# Patient Record
Sex: Female | Born: 1960 | Race: White | Hispanic: No | Marital: Married | Smoking: Never smoker
Health system: Southern US, Community
[De-identification: ages and names within clinical notes are randomized; demographics above are authoritative.]

## PROBLEM LIST (undated history)

## (undated) DIAGNOSIS — Q513 Bicornate uterus: Secondary | ICD-10-CM

## (undated) DIAGNOSIS — E041 Nontoxic single thyroid nodule: Secondary | ICD-10-CM

## (undated) DIAGNOSIS — D126 Benign neoplasm of colon, unspecified: Secondary | ICD-10-CM

## (undated) DIAGNOSIS — E039 Hypothyroidism, unspecified: Secondary | ICD-10-CM

## (undated) DIAGNOSIS — K645 Perianal venous thrombosis: Secondary | ICD-10-CM

## (undated) DIAGNOSIS — E559 Vitamin D deficiency, unspecified: Secondary | ICD-10-CM

## (undated) DIAGNOSIS — M1990 Unspecified osteoarthritis, unspecified site: Secondary | ICD-10-CM

## (undated) DIAGNOSIS — K219 Gastro-esophageal reflux disease without esophagitis: Secondary | ICD-10-CM

## (undated) DIAGNOSIS — F4320 Adjustment disorder, unspecified: Secondary | ICD-10-CM

## (undated) DIAGNOSIS — J029 Acute pharyngitis, unspecified: Secondary | ICD-10-CM

## (undated) DIAGNOSIS — R319 Hematuria, unspecified: Secondary | ICD-10-CM

## (undated) DIAGNOSIS — IMO0002 Reserved for concepts with insufficient information to code with codable children: Secondary | ICD-10-CM

## (undated) HISTORY — DX: Unspecified osteoarthritis, unspecified site: M19.90

## (undated) HISTORY — DX: Reserved for concepts with insufficient information to code with codable children: IMO0002

## (undated) HISTORY — DX: Acute pharyngitis, unspecified: J02.9

## (undated) HISTORY — DX: Perianal venous thrombosis: K64.5

## (undated) HISTORY — DX: Gastro-esophageal reflux disease without esophagitis: K21.9

## (undated) HISTORY — DX: Bicornate uterus: Q51.3

## (undated) HISTORY — DX: Vitamin D deficiency, unspecified: E55.9

## (undated) HISTORY — DX: Benign neoplasm of colon, unspecified: D12.6

## (undated) HISTORY — DX: Hematuria, unspecified: R31.9

## (undated) HISTORY — DX: Hypothyroidism, unspecified: E03.9

## (undated) HISTORY — DX: Nontoxic single thyroid nodule: E04.1

## (undated) HISTORY — DX: Adjustment disorder, unspecified: F43.20

---

## 1983-09-17 HISTORY — PX: TONSILLECTOMY: SHX5217

## 1989-09-16 HISTORY — PX: THYROIDECTOMY, PARTIAL: SHX18

## 2000-12-30 ENCOUNTER — Encounter: Payer: Self-pay | Admitting: Family Medicine

## 2000-12-30 ENCOUNTER — Ambulatory Visit (HOSPITAL_COMMUNITY): Admission: RE | Admit: 2000-12-30 | Discharge: 2000-12-30 | Payer: Self-pay | Admitting: Family Medicine

## 2006-06-18 DIAGNOSIS — K645 Perianal venous thrombosis: Secondary | ICD-10-CM | POA: Insufficient documentation

## 2006-06-23 ENCOUNTER — Encounter: Payer: Self-pay | Admitting: Family Medicine

## 2006-07-17 ENCOUNTER — Encounter: Payer: Self-pay | Admitting: Family Medicine

## 2008-03-01 ENCOUNTER — Ambulatory Visit: Payer: Self-pay | Admitting: Unknown Physician Specialty

## 2012-01-23 ENCOUNTER — Encounter: Payer: Self-pay | Admitting: Cardiovascular Disease

## 2012-01-23 ENCOUNTER — Ambulatory Visit (INDEPENDENT_AMBULATORY_CARE_PROVIDER_SITE_OTHER): Payer: BC Managed Care – PPO | Admitting: Cardiovascular Disease

## 2012-01-23 VITALS — BP 124/83 | HR 64 | Ht 66.0 in | Wt 172.8 lb

## 2012-01-23 DIAGNOSIS — I498 Other specified cardiac arrhythmias: Secondary | ICD-10-CM

## 2012-01-23 DIAGNOSIS — R079 Chest pain, unspecified: Secondary | ICD-10-CM | POA: Insufficient documentation

## 2012-01-23 DIAGNOSIS — R0989 Other specified symptoms and signs involving the circulatory and respiratory systems: Secondary | ICD-10-CM

## 2012-01-23 DIAGNOSIS — R0609 Other forms of dyspnea: Secondary | ICD-10-CM

## 2012-01-23 DIAGNOSIS — R0600 Dyspnea, unspecified: Secondary | ICD-10-CM | POA: Insufficient documentation

## 2012-01-23 DIAGNOSIS — R001 Bradycardia, unspecified: Secondary | ICD-10-CM | POA: Insufficient documentation

## 2012-01-23 NOTE — Progress Notes (Signed)
HPI  This is a 51 year old female who is referred by Liane Comber for evaluation of bradycardia, chest pain and dyspnea. The patient had no previous cardiac history. She has no history of hypertension, diabetes or hyperlipidemia. She has history of hypothyroidism on replacement therapy and her thyroid function has within normal limits. She also has history of GERD. She was noted recently to be slightly bradycardic with heart rate in the 50s. She was asymptomatic and complained of no dizziness, syncope or presyncope. She also reports chest pain described as burning sensation which lasts usually on 5 minutes and happens at rest. It is substernal and does not radiate. It does not worsen with physical activities. She does report increased exertional dyspnea a few months duration. 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 She had routine labs done recently which were unremarkable. She also had a chest x-ray which was normal. She has no history of lung disease. Recent PFTs were unremarkable. She does feel that some of her symptoms might be related to menopause  Allergies  Allergen Reactions  . Codeine   . Penicillins      Current Outpatient Prescriptions on File Prior to Visit  Medication Sig Dispense Refill  . levothyroxine (SYNTHROID, LEVOTHROID) 100 MCG tablet Take 100 mcg by mouth daily.      Marland Kitchen omeprazole (PRILOSEC) 40 MG capsule Take 40 mg by mouth daily.      Marland Kitchen PROGESTERONE MICRONIZED PO Take 2 % by mouth as needed.         Past Medical History  Diagnosis Date  . Unspecified adjustment reaction   . Bicornuate uterus   . Benign neoplasm of colon   . Degeneration of intervertebral disc, site unspecified   . Esophageal reflux   . Hematuria, unspecified   . External thrombosed hemorrhoids   . Unspecified hypothyroidism   . Osteoarthrosis, unspecified whether generalized or localized, unspecified site   . Acute pharyngitis     . Nontoxic uninodular goiter   . Unspecified vitamin D deficiency      Past Surgical History  Procedure Date  . Thyroidectomy, partial 1991  . Tonsillectomy 1985     Family History  Problem Relation Age of Onset  . Heart disease Father      History   Social History  . Marital Status: Married    Spouse Name: N/A    Number of Children: N/A  . Years of Education: N/A   Occupational History  . Not on file.   Social History Main Topics  . Smoking status: Never Smoker   . Smokeless tobacco: Not on file  . Alcohol Use: Yes  . Drug Use:   . Sexually Active:    Other Topics Concern  . Not on file   Social History Narrative  . No narrative on file     ROS Constitutional: Negative for fever, chills, diaphoresis, activity change, appetite change.  HENT: Negative for hearing loss, nosebleeds, congestion, sore throat, facial swelling, drooling, trouble swallowing, neck pain, voice change, sinus pressure and tinnitus.  Eyes: Negative for photophobia, pain, discharge and visual disturbance.  Respiratory: Negative for apnea, cough and wheezing.  Cardiovascular: Negative for palpitations and leg swelling.  Gastrointestinal: Negative for nausea, vomiting, abdominal pain, diarrhea, constipation, blood in stool and abdominal distention.  Genitourinary: Negative for dysuria, urgency, frequency, hematuria and decreased urine volume.  Musculoskeletal: Negative for myalgias, back pain, joint swelling, arthralgias and gait problem.  Skin: Negative  for color change, pallor, rash and wound.  Neurological: Negative for dizziness, tremors, seizures, syncope, speech difficulty, weakness, light-headedness, numbness and headaches.  Psychiatric/Behavioral: Negative for suicidal ideas, hallucinations, behavioral problems and agitation. The patient is not nervous/anxious.     PHYSICAL EXAM   BP 124/83  Pulse 64  Ht 5\' 6"  (1.676 m)  Wt 172 lb 12.8 oz (78.382 kg)  BMI 27.89  kg/m2 Constitutional: She is oriented to person, place, and time. She appears well-developed and well-nourished. No distress.  HENT: No nasal discharge.  Head: Normocephalic and atraumatic.  Eyes: Pupils are equal and round. Right eye exhibits no discharge. Left eye exhibits no discharge.  Neck: Normal range of motion. Neck supple. No JVD present. No thyromegaly present.  Cardiovascular: Normal rate, regular rhythm, normal heart sounds. Exam reveals no gallop and no friction rub. No murmur heard.  Pulmonary/Chest: Effort normal and breath sounds normal. No stridor. No respiratory distress. She has no wheezes. She has no rales. She exhibits no tenderness.  Abdominal: Soft. Bowel sounds are normal. She exhibits no distension. There is no tenderness. There is no rebound and no guarding.  Musculoskeletal: Normal range of motion. She exhibits no edema and no tenderness.  Neurological: She is alert and oriented to person, place, and time. Coordination normal.  Skin: Skin is warm and dry. No rash noted. She is not diaphoretic. No erythema. No pallor.  Psychiatric: She has a normal mood and affect. Her behavior is normal. Judgment and thought content normal.     EKG: Normal sinus rhythm with no significant ST or T wave changes. Septal Q waves are likely a normal variant.   ASSESSMENT AND PLAN

## 2012-01-23 NOTE — Patient Instructions (Signed)
Your physician has requested that you have an echocardiogram. Echocardiography is a painless test that uses sound waves to create images of your heart. It provides your doctor with information about the size and shape of your heart and how well your heart's chambers and valves are working. This procedure takes approximately one hour. There are no restrictions for this procedure.  Your physician has requested that you have an exercise tolerance test. For further information please visit [email or web address]. Please also follow instruction sheet, as given.  Follow up after tests.  

## 2012-01-23 NOTE — Assessment & Plan Note (Signed)
This is overall asymptomatic. Her ECG does not show significant conduction abnormalities. I don't think that this requires further attention at this time given that she reports no symptoms related to this.

## 2012-01-23 NOTE — Assessment & Plan Note (Signed)
She reports significant increase in exertional dyspnea lately. This is unusual for her given that she has been active all her lives. I will obtain an echocardiogram for further evaluation.

## 2012-01-23 NOTE — Assessment & Plan Note (Signed)
Her chest pain is overall atypical and likely due to GERD. However, due to her risk factors and associated exertional dyspnea, I recommend a treadmill stress test for further evaluation.

## 2012-01-28 ENCOUNTER — Other Ambulatory Visit: Payer: Self-pay | Admitting: Otolaryngology

## 2012-01-28 LAB — CBC WITH DIFFERENTIAL/PLATELET
Basophil #: 0 10*3/uL (ref 0.0–0.1)
Basophil %: 0.3 %
Eosinophil #: 0.2 10*3/uL (ref 0.0–0.7)
Eosinophil %: 2.2 %
HCT: 40.6 % (ref 35.0–47.0)
HGB: 13.4 g/dL (ref 12.0–16.0)
Lymphocyte #: 3.5 10*3/uL (ref 1.0–3.6)
Lymphocyte %: 33.4 %
MCH: 29.9 pg (ref 26.0–34.0)
MCHC: 33 g/dL (ref 32.0–36.0)
MCV: 91 fL (ref 80–100)
Monocyte #: 0.9 x10 3/mm (ref 0.2–0.9)
Monocyte %: 8.4 %
Neutrophil #: 5.8 10*3/uL (ref 1.4–6.5)
Neutrophil %: 55.7 %
Platelet: 295 10*3/uL (ref 150–440)
RBC: 4.48 10*6/uL (ref 3.80–5.20)
RDW: 13.6 % (ref 11.5–14.5)
WBC: 10.4 10*3/uL (ref 3.6–11.0)

## 2012-01-28 LAB — PROTIME-INR
INR: 0.9
Prothrombin Time: 12.9 secs (ref 11.5–14.7)

## 2012-01-28 LAB — APTT: Activated PTT: 30.6 secs (ref 23.6–35.9)

## 2012-01-30 ENCOUNTER — Ambulatory Visit: Payer: Self-pay | Admitting: Otolaryngology

## 2012-01-31 ENCOUNTER — Ambulatory Visit (INDEPENDENT_AMBULATORY_CARE_PROVIDER_SITE_OTHER): Payer: BC Managed Care – PPO | Admitting: Cardiovascular Disease

## 2012-01-31 DIAGNOSIS — R079 Chest pain, unspecified: Secondary | ICD-10-CM

## 2012-01-31 NOTE — Procedures (Signed)
    Treadmill Stress test  Indication: Atypical chest pain.  Baseline Data:  Resting EKG shows NSR with rate of 81 bpm, no significant ST or T wave changes. Resting blood pressure of 110/62 mm Hg Stand bruce protocal was used.  Exercise Data:  Patient exercised for 9 min 0 sec,  Peak heart rate of 160 bpm.  This was 95 % of the maximum predicted heart rate. No symptoms of chest pain or lightheadedness were reported at peak stress or in recovery.  Peak Blood pressure recorded was 160/80 Maximal work level: 10.1 METs.  Heart rate at 3 minutes in recovery was 99 bpm. BP response: Normal HR response: Normal  EKG with Exercise: Sinus tachycardia with 0.5 mm of upsloping ST depression in the inferior leads which is not diagnostic for ischemia.  FINAL IMPRESSION: Normal exercise stress test. No significant EKG changes concerning for ischemia. Very good exercise tolerance. Low risk Duke treadmill score. Normal heart rate response to exercise.  Recommendation: Proceed with echocardiogram to evaluate dyspnea.

## 2012-02-12 ENCOUNTER — Other Ambulatory Visit: Payer: Self-pay

## 2012-02-12 ENCOUNTER — Other Ambulatory Visit (INDEPENDENT_AMBULATORY_CARE_PROVIDER_SITE_OTHER): Payer: BC Managed Care – PPO

## 2012-02-12 DIAGNOSIS — R0600 Dyspnea, unspecified: Secondary | ICD-10-CM

## 2012-02-12 DIAGNOSIS — R0609 Other forms of dyspnea: Secondary | ICD-10-CM

## 2012-02-12 DIAGNOSIS — R0989 Other specified symptoms and signs involving the circulatory and respiratory systems: Secondary | ICD-10-CM

## 2012-02-13 ENCOUNTER — Ambulatory Visit (INDEPENDENT_AMBULATORY_CARE_PROVIDER_SITE_OTHER): Payer: BC Managed Care – PPO | Admitting: Cardiovascular Disease

## 2012-02-13 ENCOUNTER — Encounter: Payer: Self-pay | Admitting: Cardiovascular Disease

## 2012-02-13 VITALS — BP 112/70 | HR 80 | Ht 66.0 in | Wt 175.4 lb

## 2012-02-13 DIAGNOSIS — R001 Bradycardia, unspecified: Secondary | ICD-10-CM

## 2012-02-13 DIAGNOSIS — R0600 Dyspnea, unspecified: Secondary | ICD-10-CM

## 2012-02-13 DIAGNOSIS — R0989 Other specified symptoms and signs involving the circulatory and respiratory systems: Secondary | ICD-10-CM

## 2012-02-13 DIAGNOSIS — I498 Other specified cardiac arrhythmias: Secondary | ICD-10-CM

## 2012-02-13 DIAGNOSIS — R0609 Other forms of dyspnea: Secondary | ICD-10-CM

## 2012-02-13 NOTE — Assessment & Plan Note (Signed)
Overall atypical and likely noncardiac. Her stress test showed no evidence of ischemia.

## 2012-02-13 NOTE — Assessment & Plan Note (Signed)
This is likely physiologic and does not seem to be associated with any significant symptoms. No further cardiac testing is indicated. She can followup as needed.

## 2012-02-13 NOTE — Progress Notes (Signed)
HPI  This is a 51 year old female who is here today for a followup visit. She was seen recently for  evaluation of bradycardia, chest pain and dyspnea. The patient had no previous cardiac history. She has no history of hypertension, diabetes or hyperlipidemia. She also complained of atypical chest pain which was thought to be due to GERD. She underwent evaluation with a treadmill stress test which showed normal heart rate response to exercise without evidence of ischemia. She had an echocardiogram done which showed normal LV systolic function without significant valvular abnormalities and no evidence of pulmonary hypertension. She is overall stable.  Allergies  Allergen Reactions  . Codeine   . Penicillins      Current Outpatient Prescriptions on File Prior to Visit  Medication Sig Dispense Refill  . aspirin 81 MG tablet Take by mouth daily.       . cholecalciferol (VITAMIN D) 1000 UNITS tablet Take 1,000 Units by mouth daily.      Marland Kitchen levothyroxine (SYNTHROID, LEVOTHROID) 100 MCG tablet Take 100 mcg by mouth daily.      . Multiple Vitamin (MULTIVITAMIN) capsule Take 1 capsule by mouth daily.      Marland Kitchen omeprazole (PRILOSEC) 40 MG capsule Take 40 mg by mouth daily.      Marland Kitchen PROGESTERONE MICRONIZED PO Take 2 % by mouth as needed.         Past Medical History  Diagnosis Date  . Unspecified adjustment reaction   . Bicornuate uterus   . Benign neoplasm of colon   . Degeneration of intervertebral disc, site unspecified   . Esophageal reflux   . Hematuria, unspecified   . External thrombosed hemorrhoids   . Unspecified hypothyroidism   . Osteoarthrosis, unspecified whether generalized or localized, unspecified site   . Acute pharyngitis   . Nontoxic uninodular goiter   . Unspecified vitamin D deficiency      Past Surgical History  Procedure Date  . Thyroidectomy, partial 1991  . Tonsillectomy 1985     Family History  Problem Relation Age of Onset  . Heart disease Father       History   Social History  . Marital Status: Married    Spouse Name: N/A    Number of Children: N/A  . Years of Education: N/A   Occupational History  . Not on file.   Social History Main Topics  . Smoking status: Never Smoker   . Smokeless tobacco: Not on file  . Alcohol Use: Yes     occassionally  . Drug Use:   . Sexually Active:    Other Topics Concern  . Not on file   Social History Narrative  . No narrative on file     PHYSICAL EXAM   BP 112/70  Pulse 80  Ht 5\' 6"  (1.676 m)  Wt 175 lb 6 oz (79.55 kg)  BMI 28.31 kg/m2  Constitutional: She is oriented to person, place, and time. She appears well-developed and well-nourished. No distress.  HENT: No nasal discharge.  Head: Normocephalic and atraumatic.  Eyes: Pupils are equal and round. Right eye exhibits no discharge. Left eye exhibits no discharge.  Neck: Normal range of motion. Neck supple. No JVD present. No thyromegaly present.  Cardiovascular: Normal rate, regular rhythm, normal heart sounds. Exam reveals no gallop and no friction rub. No murmur heard.  Pulmonary/Chest: Effort normal and breath sounds normal. No stridor. No respiratory distress. She has no wheezes. She has no rales. She exhibits no tenderness.  Abdominal:  Soft. Bowel sounds are normal. She exhibits no distension. There is no tenderness. There is no rebound and no guarding.  Musculoskeletal: Normal range of motion. She exhibits no edema and no tenderness.  Neurological: She is alert and oriented to person, place, and time. Coordination normal.  Skin: Skin is warm and dry. No rash noted. She is not diaphoretic. No erythema. No pallor.  Psychiatric: She has a normal mood and affect. Her behavior is normal. Judgment and thought content normal.      ASSESSMENT AND PLAN

## 2012-02-13 NOTE — Assessment & Plan Note (Signed)
Echocardiogram was overall unremarkable.

## 2012-02-13 NOTE — Patient Instructions (Signed)
Your heart tests were fine.  Follow up as needed.  

## 2012-02-14 ENCOUNTER — Ambulatory Visit: Payer: BC Managed Care – PPO | Admitting: Cardiovascular Disease

## 2012-12-31 LAB — HM HEPATITIS C SCREENING LAB: HM Hepatitis Screen: NEGATIVE

## 2013-02-09 ENCOUNTER — Ambulatory Visit: Payer: Self-pay | Admitting: Otolaryngology

## 2013-02-18 ENCOUNTER — Ambulatory Visit: Payer: Self-pay | Admitting: Family Medicine

## 2013-04-27 ENCOUNTER — Ambulatory Visit: Payer: Self-pay | Admitting: Family Medicine

## 2015-03-01 ENCOUNTER — Encounter: Payer: Self-pay | Admitting: Physician Assistant

## 2015-03-01 ENCOUNTER — Other Ambulatory Visit: Payer: Self-pay

## 2015-03-01 ENCOUNTER — Ambulatory Visit (INDEPENDENT_AMBULATORY_CARE_PROVIDER_SITE_OTHER): Payer: BLUE CROSS/BLUE SHIELD | Admitting: Physician Assistant

## 2015-03-01 VITALS — BP 92/64 | HR 66 | Temp 98.8°F | Resp 16 | Ht 66.5 in | Wt 156.0 lb

## 2015-03-01 DIAGNOSIS — N841 Polyp of cervix uteri: Secondary | ICD-10-CM | POA: Diagnosis not present

## 2015-03-01 DIAGNOSIS — N941 Dyspareunia: Secondary | ICD-10-CM | POA: Diagnosis not present

## 2015-03-01 DIAGNOSIS — J302 Other seasonal allergic rhinitis: Secondary | ICD-10-CM

## 2015-03-01 DIAGNOSIS — Z0000 Encounter for general adult medical examination without abnormal findings: Secondary | ICD-10-CM

## 2015-03-01 DIAGNOSIS — IMO0002 Reserved for concepts with insufficient information to code with codable children: Secondary | ICD-10-CM

## 2015-03-01 DIAGNOSIS — Z8632 Personal history of gestational diabetes: Secondary | ICD-10-CM

## 2015-03-01 DIAGNOSIS — E041 Nontoxic single thyroid nodule: Secondary | ICD-10-CM | POA: Diagnosis not present

## 2015-03-01 HISTORY — DX: Nontoxic single thyroid nodule: E04.1

## 2015-03-01 MED ORDER — LEVOTHYROXINE SODIUM 100 MCG PO TABS: 100.0000 ug | ORAL_TABLET | Freq: Every day | ORAL | Status: DC

## 2015-03-01 MED ORDER — MONTELUKAST SODIUM 10 MG PO TABS: 10.0000 mg | ORAL_TABLET | Freq: Every day | ORAL | Status: DC

## 2015-03-01 NOTE — Progress Notes (Signed)
Patient ID: Yolanda Fox, female   DOB: Nov 01, 1960, 54 y.o.   MRN: [id number]   Patient: Yolanda Fox, Female    DOB: May 23, 1961, 54 y.o.   MRN: [id number] Visit Date: 03/01/2015  Today's Provider: Mar Daring, PA-C   Chief Complaint  Patient presents with  . Annual Exam   Subjective:    Annual physical exam Yolanda Fox is a 54 y.o. female who presents today for health maintenance and complete physical. She feels well. She reports exercising some, but not regularly. She reports she is sleeping well.  -----------------------------------------------------------------   Review of Systems  Constitutional: Negative for fever, chills, diaphoresis, activity change, appetite change, fatigue and unexpected weight change.  HENT: Positive for congestion and ear pain (right ear feels congested, no pain). Negative for ear discharge, postnasal drip, rhinorrhea, sinus pressure, sneezing, sore throat, tinnitus, trouble swallowing and voice change.   Eyes: Negative for pain, discharge, redness and visual disturbance.  Respiratory: Negative for cough, chest tightness, shortness of breath and wheezing.   Cardiovascular: Negative for chest pain, palpitations and leg swelling.  Gastrointestinal: Negative for nausea, vomiting, abdominal pain, diarrhea, constipation and blood in stool.  Endocrine: Positive for cold intolerance. Negative for heat intolerance, polydipsia, polyphagia and polyuria.  Genitourinary: Positive for dyspareunia. Negative for dysuria, urgency, frequency, hematuria, flank pain, decreased urine volume, vaginal bleeding, vaginal discharge, difficulty urinating, genital sores, vaginal pain, menstrual problem and pelvic pain.  Musculoskeletal: Positive for neck stiffness. Negative for myalgias, back pain, joint swelling, arthralgias, gait problem and neck pain.  Skin: Negative for color change and rash.  Allergic/Immunologic: Positive for environmental allergies. Negative for food  allergies and immunocompromised state.  Neurological: Negative for dizziness, tremors, seizures, syncope, facial asymmetry, speech difficulty, weakness, light-headedness, numbness and headaches.  Hematological: Negative for adenopathy. Does not bruise/bleed easily.  Psychiatric/Behavioral: Negative for suicidal ideas, hallucinations, behavioral problems, confusion, sleep disturbance, self-injury, dysphoric mood, decreased concentration and agitation. The patient is not nervous/anxious and is not hyperactive.     Social History She  reports that she has never smoked. She does not have any smokeless tobacco history on file. She reports that she drinks alcohol. She reports that she does not use illicit drugs.  Patient Active Problem List   Diagnosis Date Noted  . Chest pain 01/23/2012  . Dyspnea 01/23/2012  . Sinus bradycardia 01/23/2012    Past Surgical History  Procedure Laterality Date  . Thyroidectomy, partial  1991  . Tonsillectomy  1985    Family History Her family history includes Heart disease in her father.    Previous Medications   ASPIRIN 81 MG TABLET    Take by mouth 3 (three) times a week.    CETIRIZINE (ZYRTEC ALLERGY) 10 MG TABLET    Take 10 mg by mouth daily.   FLUTICASONE (FLONASE) 50 MCG/ACT NASAL SPRAY    Place into both nostrils at bedtime as needed for allergies or rhinitis.   LEVOTHYROXINE (SYNTHROID, LEVOTHROID) 100 MCG TABLET    Take 100 mcg by mouth daily.   MONTELUKAST (SINGULAIR) 10 MG TABLET    Take 10 mg by mouth at bedtime.   MULTIPLE VITAMIN (MULTIVITAMIN) CAPSULE    Take 1 capsule by mouth daily.   OMEPRAZOLE (PRILOSEC) 40 MG CAPSULE    Take 40 mg by mouth as needed.     Patient Care Team: Jerrol Banana., MD as PCP - General (Unknown Physician Specialty)     Objective:   Vitals: BP 92/64 mmHg  Pulse  66  Temp(Src) 98.8 F (37.1 C) (Oral)  Resp 16  Ht 5' 6.5" (1.689 m)  Wt 156 lb (70.761 kg)  BMI 24.80 kg/m2  SpO2 98%   Physical  Exam  Constitutional: She is oriented to person, place, and time. She appears well-developed and well-nourished. No distress.  HENT:  Head: Normocephalic and atraumatic.  Right Ear: Hearing, tympanic membrane, external ear and ear canal normal.  Left Ear: Hearing, tympanic membrane, external ear and ear canal normal.  Nose: Nose normal. Right sinus exhibits no maxillary sinus tenderness and no frontal sinus tenderness. Left sinus exhibits no maxillary sinus tenderness and no frontal sinus tenderness.  Mouth/Throat: Uvula is midline, oropharynx is clear and moist and mucous membranes are normal. No oropharyngeal exudate.  Eyes: Conjunctivae are normal. Pupils are equal, round, and reactive to light. Right eye exhibits no discharge. Left eye exhibits no discharge. No scleral icterus.  Neck: Normal range of motion. Neck supple. No JVD present. No tracheal deviation present. Thyromegaly (small palpable nodules on left thyroid that are hard, non-moveable and non-tender) present.  Cardiovascular: Normal rate, regular rhythm, normal heart sounds and intact distal pulses.  Exam reveals no gallop and no friction rub.   No murmur heard. Pulmonary/Chest: Effort normal and breath sounds normal. No respiratory distress. She has no wheezes. She has no rales. She exhibits no tenderness.  Abdominal: Soft. Bowel sounds are normal. She exhibits no distension and no mass. There is no tenderness. There is no rebound and no guarding.  Genitourinary: Rectum normal and vagina normal. No breast swelling, tenderness, discharge or bleeding. Pelvic exam was performed with patient supine. No labial fusion. There is no rash, tenderness or lesion on the right labia. There is no rash, tenderness or lesion on the left labia. Uterus is not enlarged and not tender. Cervix exhibits no motion tenderness, no discharge and no friability. Right adnexum displays no mass and no tenderness. Left adnexum displays no mass and no tenderness. No  erythema or tenderness in the vagina. No vaginal discharge found.    Musculoskeletal: Normal range of motion. She exhibits no edema or tenderness.  Lymphadenopathy:    She has no cervical adenopathy.  Neurological: She is alert and oriented to person, place, and time. She has normal reflexes. No cranial nerve deficit. Coordination normal.  Skin: Skin is warm and dry. No rash noted. She is not diaphoretic. No erythema.  Psychiatric: She has a normal mood and affect. Her behavior is normal. Judgment and thought content normal.  Vitals reviewed.    Depression Screen No flowsheet data found.    Assessment & Plan:     Routine Health Maintenance and Physical Exam  Exercise Activities and Dietary recommendations Goals    None       There is no immunization history on file for this patient.  Health Maintenance  Topic Date Due  . HIV Screening  07/24/1974  . PAP SMEAR  07/24/1977  . TETANUS/TDAP  07/24/1978  . MAMMOGRAM  07/24/2009  . COLONOSCOPY  07/24/2009  . INFLUENZA VACCINE  04/17/2015      Discussed health benefits of physical activity, and encouraged her to engage in regular exercise appropriate for her age and condition.   1. Routine general medical examination at a health care facility Cervical polyp noted on physical exam thus pap smear obtained.  Will refer to OB/GYN for further evaluation.  Mammogram scheduled next week. - Mammogram Digital Screening; Future - CBC with Differential - Basic metabolic panel - Pap IG  w/ reflex to HPV when ASC-U  2. Thyroid nodule H/O right thyroidectomy.  Left nodules were stable at last exam in 2014 and recommended 2 year f/u.  She has not noticed any changes in size. - TSH - US Soft Tissue Head/Neck; Future  3. H/O gestational diabetes mellitus, not currently pregnant Will check labs and f/u pending labs. - Hemoglobin A1c  4. Dyspareunia Possibly secondary to large cervical polyp.  Referred to Ob/GYN  5. Polyp at  cervical os Pap obtained and referral to [facility] for further evaluation. - Ambulatory referral to Obstetrics / Gynecology  6. Seasonal allergies Added singulair back to regimen as she noticed better allergy control when she was taking it also.  Discussed if not much improvement we can refer to allergist for further testing if needed. - montelukast (SINGULAIR) 10 MG tablet; Take 1 tablet (10 mg total) by mouth at bedtime.  Dispense: 90 tablet; Refill: 3     --------------------------------------------------------------------

## 2015-03-01 NOTE — Patient Instructions (Signed)
Health Maintenance Adopting a healthy lifestyle and getting preventive care can go a long way to promote health and wellness. Talk with your health care provider about what schedule of regular examinations is right for you. This is a good chance for you to check in with your provider about disease prevention and staying healthy. In between checkups, there are plenty of things you can do on your own. Experts have done a lot of research about which lifestyle changes and preventive measures are most likely to keep you healthy. Ask your health care provider for more information. WEIGHT AND DIET  Eat a healthy diet 1. Be sure to include plenty of vegetables, fruits, low-fat dairy products, and lean protein. 2. Do not eat a lot of foods high in solid fats, added sugars, or salt. 3. Get regular exercise. This is one of the most important things you can do for your health. 1. Most adults should exercise for at least 150 minutes each week. The exercise should increase your heart rate and make you sweat (moderate-intensity exercise). 2. Most adults should also do strengthening exercises at least twice a week. This is in addition to the moderate-intensity exercise.  Maintain a healthy weight 1. Body mass index (BMI) is a measurement that can be used to identify possible weight problems. It estimates body fat based on height and weight. Your health care provider can help determine your BMI and help you achieve or maintain a healthy weight. 2. For females 6 years of age and older:  1. A BMI below 18.5 is considered underweight. 2. A BMI of 18.5 to 24.9 is normal. 3. A BMI of 25 to 29.9 is considered overweight. 4. A BMI of 30 and above is considered obese.  Watch levels of cholesterol and blood lipids 1. You should start having your blood tested for lipids and cholesterol at 56 years of age, then have this test every 5 years. 2. You may need to have your cholesterol levels checked more often if: 1. Your  lipid or cholesterol levels are high. 2. You are older than 56 years of age. 3. You are at high risk for heart disease.  CANCER SCREENING   Lung Cancer 1. Lung cancer screening is recommended for adults 7-87 years old who are at high risk for lung cancer because of a history of smoking. 2. A yearly low-dose CT scan of the lungs is recommended for people who: 1. Currently smoke. 2. Have quit within the past 15 years. 3. Have at least a 30-pack-year history of smoking. A pack year is smoking an average of one pack of cigarettes a day for 1 year. 3. Yearly screening should continue until it has been 15 years since you quit. 4. Yearly screening should stop if you develop a health problem that would prevent you from having lung cancer treatment.  Breast Cancer  Practice breast self-awareness. This means understanding how your breasts normally appear and feel.  It also means doing regular breast self-exams. Let your health care provider know about any changes, no matter how small.  If you are in your 20s or 30s, you should have a clinical breast exam (CBE) by a health care provider every 1-3 years as part of a regular health exam.  If you are 30 or older, have a CBE every year. Also consider having a breast X-Madlock (mammogram) every year.  If you have a family history of breast cancer, talk to your health care provider about genetic screening.  If you are  at high risk for breast cancer, talk to your health care provider about having an MRI and a mammogram every year.  Breast cancer gene (BRCA) assessment is recommended for women who have family members with BRCA-related cancers. BRCA-related cancers include:  Breast.  Ovarian.  Tubal.  Peritoneal cancers.  Results of the assessment will determine the need for genetic counseling and BRCA1 and BRCA2 testing. Cervical Cancer Routine pelvic examinations to screen for cervical cancer are no longer recommended for nonpregnant women who  are considered low risk for cancer of the pelvic organs (ovaries, uterus, and vagina) and who do not have symptoms. A pelvic examination may be necessary if you have symptoms including those associated with pelvic infections. Ask your health care provider if a screening pelvic exam is right for you.   The Pap test is the screening test for cervical cancer for women who are considered at risk.  If you had a hysterectomy for a problem that was not cancer or a condition that could lead to cancer, then you no longer need Pap tests.  If you are older than 65 years, and you have had normal Pap tests for the past 10 years, you no longer need to have Pap tests.  If you have had past treatment for cervical cancer or a condition that could lead to cancer, you need Pap tests and screening for cancer for at least 20 years after your treatment.  If you no longer get a Pap test, assess your risk factors if they change (such as having a new sexual partner). This can affect whether you should start being screened again.  Some women have medical problems that increase their chance of getting cervical cancer. If this is the case for you, your health care provider may recommend more frequent screening and Pap tests.  The human papillomavirus (HPV) test is another test that may be used for cervical cancer screening. The HPV test looks for the virus that can cause cell changes in the cervix. The cells collected during the Pap test can be tested for HPV.  The HPV test can be used to screen women 2 years of age and older. Getting tested for HPV can extend the interval between normal Pap tests from three to five years.  An HPV test also should be used to screen women of any age who have unclear Pap test results.  After 56 years of age, women should have HPV testing as often as Pap tests.  Colorectal Cancer  This type of cancer can be detected and often prevented.  Routine colorectal cancer screening usually  begins at 56 years of age and continues through 56 years of age.  Your health care provider may recommend screening at an earlier age if you have risk factors for colon cancer.  Your health care provider may also recommend using home test kits to check for hidden blood in the stool.  A small camera at the end of a tube can be used to examine your colon directly (sigmoidoscopy or colonoscopy). This is done to check for the earliest forms of colorectal cancer.  Routine screening usually begins at age 57.  Direct examination of the colon should be repeated every 5-10 years through 56 years of age. However, you may need to be screened more often if early forms of precancerous polyps or small growths are found. Skin Cancer  Check your skin from head to toe regularly.  Tell your health care provider about any new moles or changes in  moles, especially if there is a change in a mole's shape or color.  Also tell your health care provider if you have a mole that is larger than the size of a pencil eraser.  Always use sunscreen. Apply sunscreen liberally and repeatedly throughout the day.  Protect yourself by wearing long sleeves, pants, a wide-brimmed hat, and sunglasses whenever you are outside. HEART DISEASE, DIABETES, AND HIGH BLOOD PRESSURE   Have your blood pressure checked at least every 1-2 years. High blood pressure causes heart disease and increases the risk of stroke.  If you are between 32 years and 30 years old, ask your health care provider if you should take aspirin to prevent strokes.  Have regular diabetes screenings. This involves taking a blood sample to check your fasting blood sugar level.  If you are at a normal weight and have a low risk for diabetes, have this test once every three years after 56 years of age.  If you are overweight and have a high risk for diabetes, consider being tested at a younger age or more often. PREVENTING INFECTION  Hepatitis B  If you have a  higher risk for hepatitis B, you should be screened for this virus. You are considered at high risk for hepatitis B if:  You were born in a country where hepatitis B is common. Ask your health care provider which countries are considered high risk.  Your parents were born in a high-risk country, and you have not been immunized against hepatitis B (hepatitis B vaccine).  You have HIV or AIDS.  You use needles to inject street drugs.  You live with someone who has hepatitis B.  You have had sex with someone who has hepatitis B.  You get hemodialysis treatment.  You take certain medicines for conditions, including cancer, organ transplantation, and autoimmune conditions. Hepatitis C  Blood testing is recommended for:  Everyone born from [birth year] through 1967.  Anyone with known risk factors for hepatitis C. Sexually transmitted infections (STIs)  You should be screened for sexually transmitted infections (STIs) including gonorrhea and chlamydia if:  You are sexually active and are younger than 56 years of age.  You are older than 56 years of age and your health care provider tells you that you are at risk for this type of infection.  Your sexual activity has changed since you were last screened and you are at an increased risk for chlamydia or gonorrhea. Ask your health care provider if you are at risk.  If you do not have HIV, but are at risk, it may be recommended that you take a prescription medicine daily to prevent HIV infection. This is called pre-exposure prophylaxis (PrEP). You are considered at risk if:  You are sexually active and do not regularly use condoms or know the HIV status of your partner(s).  You take drugs by injection.  You are sexually active with a partner who has HIV. Talk with your health care provider about whether you are at high risk of being infected with HIV. If you choose to begin PrEP, you should first be tested for HIV. You should then be tested  every 3 months for as long as you are taking PrEP.  PREGNANCY   If you are premenopausal and you may become pregnant, ask your health care provider about preconception counseling.  If you may become pregnant, take 400 to 800 micrograms (mcg) of folic acid every day.  If you want to prevent pregnancy, talk to your  health care provider about birth control (contraception). OSTEOPOROSIS AND MENOPAUSE   Osteoporosis is a disease in which the bones lose minerals and strength with aging. This can result in serious bone fractures. Your risk for osteoporosis can be identified using a bone density scan.  If you are 34 years of age or older, or if you are at risk for osteoporosis and fractures, ask your health care provider if you should be screened.  Ask your health care provider whether you should take a calcium or vitamin D supplement to lower your risk for osteoporosis.  Menopause may have certain physical symptoms and risks.  Hormone replacement therapy may reduce some of these symptoms and risks. Talk to your health care provider about whether hormone replacement therapy is right for you.  HOME CARE INSTRUCTIONS   Schedule regular health, dental, and eye exams.  Stay current with your immunizations.   Do not use any tobacco products including cigarettes, chewing tobacco, or electronic cigarettes.  If you are pregnant, do not drink alcohol.  If you are breastfeeding, limit how much and how often you drink alcohol.  Limit alcohol intake to no more than 1 drink per day for nonpregnant women. One drink equals 12 ounces of beer, 5 ounces of wine, or 1 ounces of hard liquor.  Do not use street drugs.  Do not share needles.  Ask your health care provider for help if you need support or information about quitting drugs.  Tell your health care provider if you often feel depressed.  Tell your health care provider if you have ever been abused or do not feel safe at home. Document  Released: 03/18/2011 Document Revised: 01/17/2014 Document Reviewed: 08/04/2013 [facility] Patient Information 2015 [place], [place]. This information is not intended to replace advice given to you by your health care provider. Make sure you discuss any questions you have with your health care provider.     Why follow it? Research shows. . Those who follow the Mediterranean diet have a reduced risk of heart disease  . The diet is associated with a reduced incidence of Parkinson's and Alzheimer's diseases . People following the diet may have longer life expectancies and lower rates of chronic diseases  . The Dietary Guidelines for Americans recommends the Mediterranean diet as an eating plan to promote health and prevent disease  What Is the Mediterranean Diet?  . Healthy eating plan based on typical foods and recipes of Mediterranean-style cooking . The diet is primarily a plant based diet; these foods should make up a majority of meals   Starches - Plant based foods should make up a majority of meals - They are an important sources of vitamins, minerals, energy, antioxidants, and fiber - Choose whole grains, foods high in fiber and minimally processed items  - Typical grain sources include wheat, oats, barley, corn, brown rice, bulgar, farro, millet, polenta, couscous  - Various types of beans include chickpeas, lentils, fava beans, black beans, white beans   Fruits  Veggies - Large quantities of antioxidant rich fruits & veggies; 6 or more servings  - Vegetables can be eaten raw or lightly drizzled with oil and cooked  - Vegetables common to the traditional Mediterranean Diet include: artichokes, arugula, beets, broccoli, brussel sprouts, cabbage, carrots, celery, collard greens, cucumbers, eggplant, kale, leeks, lemons, lettuce, mushrooms, okra, onions, peas, peppers, potatoes, pumpkin, radishes, rutabaga, shallots, spinach, sweet potatoes, turnips, zucchini - Fruits common to the Mediterranean  Diet include: apples, apricots, avocados, cherries, clementines, dates, figs, grapefruits,  grapes, melons, nectarines, oranges, peaches, pears, pomegranates, strawberries, tangerines  Fats - Replace butter and margarine with healthy oils, such as olive oil, canola oil, and tahini  - Limit nuts to no more than a handful a day  - Nuts include walnuts, almonds, pecans, pistachios, pine nuts  - Limit or avoid candied, honey roasted or heavily salted nuts - Olives are central to the Mediterranean diet - can be eaten whole or used in a variety of dishes   Meats Protein - Limiting red meat: no more than a few times a month - When eating red meat: choose lean cuts and keep the portion to the size of deck of cards - Eggs: approx. 0 to 4 times a week  - Fish and lean poultry: at least 2 a week  - Healthy protein sources include, chicken, [place], lean beef, lamb - Increase intake of seafood such as tuna, salmon, trout, mackerel, shrimp, scallops - Avoid or limit high fat processed meats such as sausage and bacon  Dairy - Include moderate amounts of low fat dairy products  - Focus on healthy dairy such as fat free yogurt, skim milk, low or reduced fat cheese - Limit dairy products higher in fat such as whole or 2% milk, cheese, ice cream  Alcohol - Moderate amounts of red wine is ok  - No more than 5 oz daily for women (all ages) and men older than age 74  - No more than 10 oz of wine daily for men younger than 44  Other - Limit sweets and other desserts  - Use herbs and spices instead of salt to flavor foods  - Herbs and spices common to the traditional Mediterranean Diet include: basil, bay leaves, chives, cloves, cumin, fennel, garlic, lavender, marjoram, mint, oregano, parsley, pepper, rosemary, sage, savory, sumac, tarragon, thyme   It's not just a diet, it's a lifestyle:  . The Mediterranean diet includes lifestyle factors typical of those in the region  . Foods, drinks and meals are best eaten  with others and savored . Daily physical activity is important for overall good health . This could be strenuous exercise like running and aerobics . This could also be more leisurely activities such as walking, housework, yard-work, or taking the stairs . Moderation is the key; a balanced and healthy diet accommodates most foods and drinks . Consider portion sizes and frequency of consumption of certain foods   Meal Ideas & Options:  . Breakfast:  o Whole wheat toast or whole wheat English muffins with peanut butter & hard boiled egg o Steel cut oats topped with apples & cinnamon and skim milk  o Fresh fruit: banana, strawberries, melon, berries, peaches  o Smoothies: strawberries, bananas, greek yogurt, peanut butter o Low fat greek yogurt with blueberries and granola  o Egg white omelet with spinach and mushrooms o Breakfast couscous: whole wheat couscous, apricots, skim milk, cranberries  . Sandwiches:  o Hummus and grilled vegetables (peppers, zucchini, squash) on whole wheat bread   o Grilled chicken on whole wheat pita with lettuce, tomatoes, cucumbers or tzatziki  o Tuna salad on whole wheat bread: tuna salad made with greek yogurt, olives, red peppers, capers, green onions o Garlic rosemary lamb pita: lamb sauted with garlic, rosemary, salt & pepper; add lettuce, cucumber, greek yogurt to pita - flavor with lemon juice and black pepper  . Seafood:  o Mediterranean grilled salmon, seasoned with garlic, basil, parsley, lemon juice and black pepper o Shrimp, lemon, and spinach  whole-grain pasta salad made with low fat greek yogurt  o Seared scallops with lemon orzo  o Seared tuna steaks seasoned salt, pepper, coriander topped with tomato mixture of olives, tomatoes, olive oil, minced garlic, parsley, green onions and cappers  . Meats:  o Herbed greek chicken salad with kalamata olives, cucumber, feta  o Red bell peppers stuffed with spinach, bulgur, lean ground beef (or lentils) &  topped with feta   o Kebabs: skewers of chicken, tomatoes, onions, zucchini, squash  o [place] burgers: made with red onions, mint, dill, lemon juice, feta cheese topped with roasted red peppers . Vegetarian o Cucumber salad: cucumbers, artichoke hearts, celery, red onion, feta cheese, tossed in olive oil & lemon juice  o Hummus and whole grain pita points with a greek salad (lettuce, tomato, feta, olives, cucumbers, red onion) o Lentil soup with celery, carrots made with vegetable broth, garlic, salt and pepper  o Tabouli salad: parsley, bulgur, mint, scallions, cucumbers, tomato, radishes, lemon juice, olive oil, salt and pepper.      American Heart Association (AHA) Exercise Recommendation  Being physically active is important to prevent heart disease and stroke, the nation's No. 1and No. 5killers. To improve overall cardiovascular health, we suggest at least 150 minutes per week of moderate exercise or 75 minutes per week of vigorous exercise (or a combination of moderate and vigorous activity). Thirty minutes a day, five times a week is an easy goal to remember. You will also experience benefits even if you divide your time into two or three segments of 10 to 15 minutes per day.  For people who would benefit from lowering their blood pressure or cholesterol, we recommend 40 minutes of aerobic exercise of moderate to vigorous intensity three to four times a week to lower the risk for heart attack and stroke.  Physical activity is anything that makes you move your body and burn calories.  This includes things like climbing stairs or playing sports. Aerobic exercises benefit your heart, and include walking, jogging, swimming or biking. Strength and stretching exercises are best for overall stamina and flexibility.  The simplest, positive change you can make to effectively improve your heart health is to start walking. It's enjoyable, free, easy, social and great exercise. A walking program is  flexible and boasts high success rates because people can stick with it. It's easy for walking to become a regular and satisfying part of life.   For Overall Cardiovascular Health:  At least 30 minutes of moderate-intensity aerobic activity at least 5 days per week for a total of 150  OR   At least 25 minutes of vigorous aerobic activity at least 3 days per week for a total of 75 minutes; or a combination of moderate- and vigorous-intensity aerobic activity  AND   Moderate- to high-intensity muscle-strengthening activity at least 2 days per week for additional health benefits.  For Lowering Blood Pressure and Cholesterol  An average 40 minutes of moderate- to vigorous-intensity aerobic activity 3 or 4 times per week  What if I can't make it to the time goal? Something is always better than nothing! And everyone has to start somewhere. Even if you've been sedentary for years, today is the day you can begin to make healthy changes in your life. If you don't think you'll make it for 30 or 40 minutes, set a reachable goal for today. You can work up toward your overall goal by increasing your time as you get stronger. Don't let all-or-nothing thinking  rob you of doing what you can every day.  Source:http://www.heart.org

## 2015-03-02 ENCOUNTER — Telehealth: Payer: Self-pay

## 2015-03-02 LAB — BASIC METABOLIC PANEL
BUN/Creatinine Ratio: 18 (ref 9–23)
BUN: 14 mg/dL (ref 6–24)
CO2: 29 mmol/L (ref 18–29)
Calcium: 9.6 mg/dL (ref 8.7–10.2)
Chloride: 102 mmol/L (ref 97–108)
Creatinine, Ser: 0.77 mg/dL (ref 0.57–1.00)
GFR calc Af Amer: 101 mL/min/{1.73_m2} (ref >59)
GFR calc non Af Amer: 87 mL/min/{1.73_m2} (ref >59)
Glucose: 79 mg/dL (ref 65–99)
Potassium: 4.4 mmol/L (ref 3.5–5.2)
Sodium: 144 mmol/L (ref 134–144)

## 2015-03-02 LAB — CBC WITH DIFFERENTIAL/PLATELET
Basophils Absolute: 0.1 10*3/uL (ref 0.0–0.2)
Basos: 1 %
EOS (ABSOLUTE): 0.2 10*3/uL (ref 0.0–0.4)
Eos: 2 %
Hematocrit: 40.5 % (ref 34.0–46.6)
Hemoglobin: 13.2 g/dL (ref 11.1–15.9)
Immature Grans (Abs): 0 10*3/uL (ref 0.0–0.1)
Immature Granulocytes: 0 %
Lymphocytes Absolute: 2.9 10*3/uL (ref 0.7–3.1)
Lymphs: 31 %
MCH: 29.7 pg (ref 26.6–33.0)
MCHC: 32.6 g/dL (ref 31.5–35.7)
MCV: 91 fL (ref 79–97)
Monocytes Absolute: 0.7 10*3/uL (ref 0.1–0.9)
Monocytes: 7 %
Neutrophils Absolute: 5.7 10*3/uL (ref 1.4–7.0)
Neutrophils: 59 %
Platelets: 325 10*3/uL (ref 150–379)
RBC: 4.44 x10E6/uL (ref 3.77–5.28)
RDW: 13.1 % (ref 12.3–15.4)
WBC: 9.6 10*3/uL (ref 3.4–10.8)

## 2015-03-02 LAB — TSH: TSH: 1.27 u[IU]/mL (ref 0.450–4.500)

## 2015-03-02 LAB — HEMOGLOBIN A1C
Est. average glucose Bld gHb Est-mCnc: 117 mg/dL
Hgb A1c MFr Bld: 5.7 % — ABNORMAL HIGH (ref 4.8–5.6)

## 2015-03-02 NOTE — Telephone Encounter (Signed)
Pt is returning call.  [id number]#[phone]/[id number]

## 2015-03-02 NOTE — Telephone Encounter (Signed)
LMTCB

## 2015-03-02 NOTE — Telephone Encounter (Signed)
-----   Message from Mar Daring, PA-C sent at 03/02/2015  8:15 AM EDT ----- CBC, CMP, and TSH are WNL.  HgBA1c is slightly elevated at 5.7 (high normal is 5.6) indicating a pre-diabetic state.  Work on diet and exercise.  Try to avoid heavy carbohydrate meals and sugars.  Pap is still pending.

## 2015-03-03 NOTE — Telephone Encounter (Signed)
Patient advised as directed below. Patient verbalized understanding.  

## 2015-03-06 ENCOUNTER — Ambulatory Visit: Payer: Self-pay

## 2015-03-07 LAB — PAP IG W/ RFLX HPV ASCU: PAP Smear Comment: 0

## 2015-03-08 ENCOUNTER — Telehealth: Payer: Self-pay

## 2015-03-08 NOTE — Telephone Encounter (Signed)
Patient advised as directed below. Patient verbalized understanding.  

## 2015-03-08 NOTE — Telephone Encounter (Signed)
-----   Message from Mar Daring, [place] sent at 03/08/2015  8:10 AM EDT ----- Pap was normal and negative for HPV.

## 2015-03-09 ENCOUNTER — Ambulatory Visit
Admission: RE | Admit: 2015-03-09 | Discharge: 2015-03-09 | Disposition: A | Discharge status: Home / Self Care | Payer: BLUE CROSS/BLUE SHIELD | Source: Ambulatory Visit | Attending: Physician Assistant | Admitting: Physician Assistant

## 2015-03-09 DIAGNOSIS — E041 Nontoxic single thyroid nodule: Secondary | ICD-10-CM

## 2015-03-10 ENCOUNTER — Telehealth: Payer: Self-pay

## 2015-03-10 NOTE — Telephone Encounter (Signed)
Patient advised as directed below. Patient verbalized understanding.  

## 2015-03-10 NOTE — Telephone Encounter (Signed)
-----   Message from Mar Daring, [place] sent at 03/09/2015  5:01 PM EDT ----- Thyroid nodule is stable and TSH is normal.  Will repeat US in one year.

## 2015-03-13 ENCOUNTER — Encounter: Payer: Self-pay | Admitting: Family Medicine

## 2015-03-23 ENCOUNTER — Encounter: Payer: Self-pay | Admitting: Physician Assistant

## 2016-01-10 DIAGNOSIS — L719 Rosacea, unspecified: Secondary | ICD-10-CM | POA: Diagnosis not present

## 2016-01-10 DIAGNOSIS — Z79899 Other long term (current) drug therapy: Secondary | ICD-10-CM | POA: Diagnosis not present

## 2016-01-10 DIAGNOSIS — L820 Inflamed seborrheic keratosis: Secondary | ICD-10-CM | POA: Diagnosis not present

## 2016-01-25 DIAGNOSIS — R21 Rash and other nonspecific skin eruption: Secondary | ICD-10-CM | POA: Diagnosis not present

## 2016-01-25 DIAGNOSIS — L237 Allergic contact dermatitis due to plants, except food: Secondary | ICD-10-CM | POA: Diagnosis not present

## 2016-03-06 ENCOUNTER — Ambulatory Visit (INDEPENDENT_AMBULATORY_CARE_PROVIDER_SITE_OTHER): Payer: BLUE CROSS/BLUE SHIELD | Admitting: Physician Assistant

## 2016-03-06 ENCOUNTER — Encounter: Payer: Self-pay | Admitting: Physician Assistant

## 2016-03-06 VITALS — BP 140/80 | HR 60 | Temp 98.2°F | Resp 16 | Wt 162.6 lb

## 2016-03-06 DIAGNOSIS — F4320 Adjustment disorder, unspecified: Secondary | ICD-10-CM | POA: Insufficient documentation

## 2016-03-06 DIAGNOSIS — K219 Gastro-esophageal reflux disease without esophagitis: Secondary | ICD-10-CM | POA: Insufficient documentation

## 2016-03-06 DIAGNOSIS — N951 Menopausal and female climacteric states: Secondary | ICD-10-CM | POA: Insufficient documentation

## 2016-03-06 DIAGNOSIS — Z13220 Encounter for screening for lipoid disorders: Secondary | ICD-10-CM

## 2016-03-06 DIAGNOSIS — J45909 Unspecified asthma, uncomplicated: Secondary | ICD-10-CM | POA: Insufficient documentation

## 2016-03-06 DIAGNOSIS — E559 Vitamin D deficiency, unspecified: Secondary | ICD-10-CM | POA: Insufficient documentation

## 2016-03-06 DIAGNOSIS — J309 Allergic rhinitis, unspecified: Secondary | ICD-10-CM | POA: Insufficient documentation

## 2016-03-06 DIAGNOSIS — Z1239 Encounter for other screening for malignant neoplasm of breast: Secondary | ICD-10-CM

## 2016-03-06 DIAGNOSIS — Z1159 Encounter for screening for other viral diseases: Secondary | ICD-10-CM

## 2016-03-06 DIAGNOSIS — M9979 Connective tissue and disc stenosis of intervertebral foramina of abdomen and other regions: Secondary | ICD-10-CM | POA: Insufficient documentation

## 2016-03-06 DIAGNOSIS — Z0000 Encounter for general adult medical examination without abnormal findings: Secondary | ICD-10-CM | POA: Diagnosis not present

## 2016-03-06 DIAGNOSIS — N841 Polyp of cervix uteri: Secondary | ICD-10-CM | POA: Insufficient documentation

## 2016-03-06 DIAGNOSIS — E039 Hypothyroidism, unspecified: Secondary | ICD-10-CM | POA: Insufficient documentation

## 2016-03-06 DIAGNOSIS — E041 Nontoxic single thyroid nodule: Secondary | ICD-10-CM

## 2016-03-06 DIAGNOSIS — Z833 Family history of diabetes mellitus: Secondary | ICD-10-CM

## 2016-03-06 DIAGNOSIS — Z136 Encounter for screening for cardiovascular disorders: Secondary | ICD-10-CM

## 2016-03-06 DIAGNOSIS — D126 Benign neoplasm of colon, unspecified: Secondary | ICD-10-CM | POA: Insufficient documentation

## 2016-03-06 DIAGNOSIS — M1990 Unspecified osteoarthritis, unspecified site: Secondary | ICD-10-CM | POA: Insufficient documentation

## 2016-03-06 DIAGNOSIS — R001 Bradycardia, unspecified: Secondary | ICD-10-CM | POA: Insufficient documentation

## 2016-03-06 DIAGNOSIS — Q513 Bicornate uterus: Secondary | ICD-10-CM | POA: Insufficient documentation

## 2016-03-06 NOTE — Patient Instructions (Signed)
Health Maintenance, Female Adopting a healthy lifestyle and getting preventive care can go a long way to promote health and wellness. Talk with your health care provider about what schedule of regular examinations is right for you. This is a good chance for you to check in with your provider about disease prevention and staying healthy. In between checkups, there are plenty of things you can do on your own. Experts have done a lot of research about which lifestyle changes and preventive measures are most likely to keep you healthy. Ask your health care provider for more information. WEIGHT AND DIET  Eat a healthy diet  Be sure to include plenty of vegetables, fruits, low-fat dairy products, and lean protein.  Do not eat a lot of foods high in solid fats, added sugars, or salt.  Get regular exercise. This is one of the most important things you can do for your health.  Most adults should exercise for at least 150 minutes each week. The exercise should increase your heart rate and make you sweat (moderate-intensity exercise).  Most adults should also do strengthening exercises at least twice a week. This is in addition to the moderate-intensity exercise.  Maintain a healthy weight  Body mass index (BMI) is a measurement that can be used to identify possible weight problems. It estimates body fat based on height and weight. Your health care provider can help determine your BMI and help you achieve or maintain a healthy weight.  For females 28 years of age and older:   A BMI below 18.5 is considered underweight.  A BMI of 18.5 to 24.9 is normal.  A BMI of 25 to 29.9 is considered overweight.  A BMI of 30 and above is considered obese.  Watch levels of cholesterol and blood lipids  You should start having your blood tested for lipids and cholesterol at 57 years of age, then have this test every 5 years.  You may need to have your cholesterol levels checked more often if:  Your lipid  or cholesterol levels are high.  You are older than 57 years of age.  You are at high risk for heart disease.  CANCER SCREENING   Lung Cancer  Lung cancer screening is recommended for adults 75-66 years old who are at high risk for lung cancer because of a history of smoking.  A yearly low-dose CT scan of the lungs is recommended for people who:  Currently smoke.  Have quit within the past 15 years.  Have at least a 30-pack-year history of smoking. A pack year is smoking an average of one pack of cigarettes a day for 1 year.  Yearly screening should continue until it has been 15 years since you quit.  Yearly screening should stop if you develop a health problem that would prevent you from having lung cancer treatment.  Breast Cancer  Practice breast self-awareness. This means understanding how your breasts normally appear and feel.  It also means doing regular breast self-exams. Let your health care provider know about any changes, no matter how small.  If you are in your 20s or 30s, you should have a clinical breast exam (CBE) by a health care provider every 1-3 years as part of a regular health exam.  If you are 25 or older, have a CBE every year. Also consider having a breast X-ray (mammogram) every year.  If you have a family history of breast cancer, talk to your health care provider about genetic screening.  If you  are at high risk for breast cancer, talk to your health care provider about having an MRI and a mammogram every year.  Breast cancer gene (BRCA) assessment is recommended for women who have family members with BRCA-related cancers. BRCA-related cancers include:  Breast.  Ovarian.  Tubal.  Peritoneal cancers.  Results of the assessment will determine the need for genetic counseling and BRCA1 and BRCA2 testing. Cervical Cancer Your health care provider may recommend that you be screened regularly for cancer of the pelvic organs (ovaries, uterus, and  vagina). This screening involves a pelvic examination, including checking for microscopic changes to the surface of your cervix (Pap test). You may be encouraged to have this screening done every 3 years, beginning at age 46.  For women ages 47-65, health care providers may recommend pelvic exams and Pap testing every 3 years, or they may recommend the Pap and pelvic exam, combined with testing for human papilloma virus (HPV), every 5 years. Some types of HPV increase your risk of cervical cancer. Testing for HPV may also be done on women of any age with unclear Pap test results.  Other health care providers may not recommend any screening for nonpregnant women who are considered low risk for pelvic cancer and who do not have symptoms. Ask your health care provider if a screening pelvic exam is right for you.  If you have had past treatment for cervical cancer or a condition that could lead to cancer, you need Pap tests and screening for cancer for at least 20 years after your treatment. If Pap tests have been discontinued, your risk factors (such as having a new sexual partner) need to be reassessed to determine if screening should resume. Some women have medical problems that increase the chance of getting cervical cancer. In these cases, your health care provider may recommend more frequent screening and Pap tests. Colorectal Cancer  This type of cancer can be detected and often prevented.  Routine colorectal cancer screening usually begins at 57 years of age and continues through 57 years of age.  Your health care provider may recommend screening at an earlier age if you have risk factors for colon cancer.  Your health care provider may also recommend using home test kits to check for hidden blood in the stool.  A small camera at the end of a tube can be used to examine your colon directly (sigmoidoscopy or colonoscopy). This is done to check for the earliest forms of colorectal  cancer.  Routine screening usually begins at age 39.  Direct examination of the colon should be repeated every 5-10 years through 57 years of age. However, you may need to be screened more often if early forms of precancerous polyps or small growths are found. Skin Cancer  Check your skin from head to toe regularly.  Tell your health care provider about any new moles or changes in moles, especially if there is a change in a mole's shape or color.  Also tell your health care provider if you have a mole that is larger than the size of a pencil eraser.  Always use sunscreen. Apply sunscreen liberally and repeatedly throughout the day.  Protect yourself by wearing long sleeves, pants, a wide-brimmed hat, and sunglasses whenever you are outside. HEART DISEASE, DIABETES, AND HIGH BLOOD PRESSURE   High blood pressure causes heart disease and increases the risk of stroke. High blood pressure is more likely to develop in:  People who have blood pressure in the high end  of the normal range (130-139/85-89 mm Hg).  People who are overweight or obese.  People who are African American.  If you are 38-23 years of age, have your blood pressure checked every 3-5 years. If you are 61 years of age or older, have your blood pressure checked every year. You should have your blood pressure measured twice--once when you are at a hospital or clinic, and once when you are not at a hospital or clinic. Record the average of the two measurements. To check your blood pressure when you are not at a hospital or clinic, you can use:  An automated blood pressure machine at a pharmacy.  A home blood pressure monitor.  If you are between 45 years and 39 years old, ask your health care provider if you should take aspirin to prevent strokes.  Have regular diabetes screenings. This involves taking a blood sample to check your fasting blood sugar level.  If you are at a normal weight and have a low risk for diabetes,  have this test once every three years after 57 years of age.  If you are overweight and have a high risk for diabetes, consider being tested at a younger age or more often. PREVENTING INFECTION  Hepatitis B  If you have a higher risk for hepatitis B, you should be screened for this virus. You are considered at high risk for hepatitis B if:  You were born in a country where hepatitis B is common. Ask your health care provider which countries are considered high risk.  Your parents were born in a high-risk country, and you have not been immunized against hepatitis B (hepatitis B vaccine).  You have HIV or AIDS.  You use needles to inject street drugs.  You live with someone who has hepatitis B.  You have had sex with someone who has hepatitis B.  You get hemodialysis treatment.  You take certain medicines for conditions, including cancer, organ transplantation, and autoimmune conditions. Hepatitis C  Blood testing is recommended for:  Everyone born from [birth year] through 1967.  Anyone with known risk factors for hepatitis C. Sexually transmitted infections (STIs)  You should be screened for sexually transmitted infections (STIs) including gonorrhea and chlamydia if:  You are sexually active and are younger than 57 years of age.  You are older than 57 years of age and your health care provider tells you that you are at risk for this type of infection.  Your sexual activity has changed since you were last screened and you are at an increased risk for chlamydia or gonorrhea. Ask your health care provider if you are at risk.  If you do not have HIV, but are at risk, it may be recommended that you take a prescription medicine daily to prevent HIV infection. This is called pre-exposure prophylaxis (PrEP). You are considered at risk if:  You are sexually active and do not regularly use condoms or know the HIV status of your partner(s).  You take drugs by injection.  You are sexually  active with a partner who has HIV. Talk with your health care provider about whether you are at high risk of being infected with HIV. If you choose to begin PrEP, you should first be tested for HIV. You should then be tested every 3 months for as long as you are taking PrEP.  PREGNANCY   If you are premenopausal and you may become pregnant, ask your health care provider about preconception counseling.  If you may  become pregnant, take 400 to 800 micrograms (mcg) of folic acid every day.  If you want to prevent pregnancy, talk to your health care provider about birth control (contraception). OSTEOPOROSIS AND MENOPAUSE   Osteoporosis is a disease in which the bones lose minerals and strength with aging. This can result in serious bone fractures. Your risk for osteoporosis can be identified using a bone density scan.  If you are 61 years of age or older, or if you are at risk for osteoporosis and fractures, ask your health care provider if you should be screened.  Ask your health care provider whether you should take a calcium or vitamin D supplement to lower your risk for osteoporosis.  Menopause may have certain physical symptoms and risks.  Hormone replacement therapy may reduce some of these symptoms and risks. Talk to your health care provider about whether hormone replacement therapy is right for you.  HOME CARE INSTRUCTIONS   Schedule regular health, dental, and eye exams.  Stay current with your immunizations.   Do not use any tobacco products including cigarettes, chewing tobacco, or electronic cigarettes.  If you are pregnant, do not drink alcohol.  If you are breastfeeding, limit how much and how often you drink alcohol.  Limit alcohol intake to no more than 1 drink per day for nonpregnant women. One drink equals 12 ounces of beer, 5 ounces of wine, or 1 ounces of hard liquor.  Do not use street drugs.  Do not share needles.  Ask your health care provider for help if  you need support or information about quitting drugs.  Tell your health care provider if you often feel depressed.  Tell your health care provider if you have ever been abused or do not feel safe at home.   This information is not intended to replace advice given to you by your health care provider. Make sure you discuss any questions you have with your health care provider.   Document Released: 03/18/2011 Document Revised: 09/23/2014 Document Reviewed: 08/04/2013 Elsevier Interactive Patient Education [employer].

## 2016-03-06 NOTE — Progress Notes (Signed)
Patient: Yolanda Fox, Female    DOB: 09-24-1960, 55 y.o.   MRN: [id number] Visit Date: 03/06/2016  Today's Provider: Mar Daring, PA-C   Chief Complaint  Patient presents with  . Annual Exam   Subjective:    Annual physical exam Yolanda Fox is a 55 y.o. female who presents today for health maintenance and complete physical. She feels well. She reports exercising. She walks, does water aerobics and weights. She reports she is sleeping well.  Last PCP: 03/01/2015 Mammogram:03/10/15 Normal; [facility] Pap: 03/01/15 Normal and HPV Negative; She did have a polyp that was eventually removed by Dr. Laurey Morale. Colonoscopy:07/08/2013-DrVira Agar repeat in 5 years due to polyps -----------------------------------------------------------------   Review of Systems  Constitutional: Positive for chills. Negative for fever and fatigue.  HENT: Positive for drooling, hearing loss (feels like normal aging) and sinus pressure (chronic during season).   Eyes: Negative.   Respiratory: Negative.   Cardiovascular: Negative.   Gastrointestinal: Positive for constipation, abdominal distention and anal bleeding (hemorrhoid). Negative for nausea, vomiting and diarrhea.  Endocrine: Positive for cold intolerance.  Genitourinary: Positive for dyspareunia (since menopause; has tried many HRT without relief).  Musculoskeletal: Positive for back pain (Occasional) and neck stiffness.  Skin: Negative.   Allergic/Immunologic: Negative.   Neurological: Negative.   Hematological: Negative.   Psychiatric/Behavioral: Positive for decreased concentration.    Social History      She  reports that she has never smoked. She does not have any smokeless tobacco history on file. She reports that she drinks alcohol. She reports that she does not use illicit drugs.       Social History   Social History  . Marital Status: Married    Spouse Name: N/A  . Number of Children: N/A  . Years of  Education: N/A   Social History Main Topics  . Smoking status: Never Smoker   . Smokeless tobacco: None  . Alcohol Use: 0.0 oz/week    0 Standard drinks or equivalent per week     Comment: occassionally  . Drug Use: No  . Sexual Activity: Not Asked   Other Topics Concern  . None   Social History Narrative    Past Medical History  Diagnosis Date  . Unspecified adjustment reaction   . Bicornuate uterus   . Benign neoplasm of colon   . Degeneration of intervertebral disc, site unspecified   . Esophageal reflux   . Hematuria, unspecified   . External thrombosed hemorrhoids   . Unspecified hypothyroidism   . Osteoarthrosis, unspecified whether generalized or localized, unspecified site   . Acute pharyngitis   . Nontoxic uninodular goiter   . Unspecified vitamin D deficiency      Patient Active Problem List   Diagnosis Date Noted  . Vitamin D deficiency 03/06/2016  . RAD (reactive airway disease) 03/06/2016  . Arthritis, degenerative 03/06/2016  . Adult hypothyroidism 03/06/2016  . Hot flash, menopausal 03/06/2016  . Acid reflux 03/06/2016  . Narrowing of intervertebral disc space 03/06/2016  . Benign neoplasm of colon 03/06/2016  . Cervical polyp 03/06/2016  . Bradycardia 03/06/2016  . Bicornate uterus 03/06/2016  . AB (asthmatic bronchitis) 03/06/2016  . Allergic rhinitis 03/06/2016  . Adaptation reaction 03/06/2016  . Thyroid nodule 03/01/2015  . Chest pain 01/23/2012  . Dyspnea 01/23/2012  . Sinus bradycardia 01/23/2012  . External hemorrhoid, thrombosed 06/18/2006    Past Surgical History  Procedure Laterality Date  . Thyroidectomy, partial  1991  .  Tonsillectomy  1985    Family History        Family Status  Relation Status Death Age  . Mother Deceased     rheumatoid arthritis, peptic ulcer, gastroesophageal reflux disease, emphysema, hiatal hernia, gallbladder disease, colon polyps  . Father Deceased     allergies, hypertension, arthritis,  coronary artery disease, renal disease, alcoholism, colon polyps  . Sister Alive     back surgery  . Brother Alive     back problems  . Brother Alive     diabetes mellitus type II, Hypertension,back problems  . Sister Deceased     mva        Her family history includes Heart disease in her father.    Allergies  Allergen Reactions  . Codeine   . Penicillins     Current Meds  Medication Sig  . aspirin 81 MG tablet Take by mouth 3 (three) times a week.   . cetirizine (ZYRTEC ALLERGY) 10 MG tablet Take 10 mg by mouth daily.  . fluticasone (FLONASE) 50 MCG/ACT nasal spray Place into both nostrils at bedtime as needed for allergies or rhinitis.  Marland Kitchen levothyroxine (SYNTHROID) 100 MCG tablet Take 1 tablet (100 mcg total) by mouth daily before breakfast.  . montelukast (SINGULAIR) 10 MG tablet Take 1 tablet (10 mg total) by mouth at bedtime.  . Multiple Vitamin (MULTIVITAMIN) capsule Take 1 capsule by mouth daily.  Marland Kitchen omeprazole (PRILOSEC) 40 MG capsule Take 40 mg by mouth as needed.     Patient Care Team: Jerrol Banana., MD as PCP - General (Unknown Physician Specialty)     Objective:   Vitals: BP 140/80 mmHg  Pulse 60  Temp(Src) 98.2 F (36.8 C) (Oral)  Resp 16  Wt 162 lb 9.6 oz (73.755 kg)   Physical Exam  Constitutional: She is oriented to person, place, and time. She appears well-developed and well-nourished. No distress.  HENT:  Head: Normocephalic and atraumatic.  Right Ear: External ear normal.  Left Ear: External ear normal.  Nose: Nose normal.  Mouth/Throat: Oropharynx is clear and moist. No oropharyngeal exudate.  Eyes: Conjunctivae and EOM are normal. Pupils are equal, round, and reactive to light. Right eye exhibits no discharge. Left eye exhibits no discharge. No scleral icterus.  Neck: Normal range of motion. Neck supple. No JVD present. No tracheal deviation present. No thyromegaly present.  Cardiovascular: Normal rate, regular rhythm, normal heart  sounds and intact distal pulses.  Exam reveals no gallop and no friction rub.   No murmur heard. Pulmonary/Chest: Effort normal and breath sounds normal. No respiratory distress. She has no wheezes. She has no rales. She exhibits no tenderness. Right breast exhibits no inverted nipple, no mass, no nipple discharge, no skin change and no tenderness. Left breast exhibits no inverted nipple, no mass, no nipple discharge, no skin change and no tenderness. Breasts are symmetrical.  Abdominal: Soft. Bowel sounds are normal. She exhibits no distension and no mass. There is no tenderness. There is no rebound and no guarding.  Musculoskeletal: Normal range of motion. She exhibits no edema or tenderness.  Lymphadenopathy:    She has no cervical adenopathy.  Neurological: She is alert and oriented to person, place, and time.  Skin: Skin is warm and dry. No rash noted. She is not diaphoretic.  Psychiatric: She has a normal mood and affect. Her behavior is normal. Judgment and thought content normal.  Vitals reviewed.    Depression Screen PHQ 2/9 Scores 03/01/2015  PHQ -  2 Score 0      Assessment & Plan:     Routine Health Maintenance and Physical Exam  Exercise Activities and Dietary recommendations Goals    None       There is no immunization history on file for this patient.  Health Maintenance  Topic Date Due  . Hepatitis C Screening  [birth year]  . HIV Screening  07/24/1974  . TETANUS/TDAP  07/24/1978  . INFLUENZA VACCINE  04/16/2016  . MAMMOGRAM  03/12/2017  . PAP SMEAR  02/28/2018  . COLONOSCOPY  07/09/2023      Discussed health benefits of physical activity, and encouraged her to engage in regular exercise appropriate for her age and condition.   1. Annual physical exam Normal physical exam today. Will check labs as below and f/u pending lab results. If labs are stable and WNL she will not need to have these rechecked for one year at her next annual physical exam. She is to  call the office in the meantime if she has any acute issue, questions or concerns. - CBC with Differential/Platelet - Comprehensive metabolic panel - TSH  2. Breast cancer screening Breast exam today was normal. There is no family history of breast cancer. She does perform regular self breast exams. Mammogram was ordered as below. Will send order to [facility] to be scheduled. - MM Digital Screening; Future  3. Need for hepatitis C screening test - Hepatitis C antibody screen  4. Thyroid nodule Will recheck TSH to make sure stable. If not will refer to Endocrinology. - TSH  5. Encounter for lipid screening for cardiovascular disease Will check labs as below and f/u pending results. - Lipid panel  6. Family history of diabetes mellitus Will check labs as below and f/u pending results. - HgB A1c  --------------------------------------------------------------------    Mar Daring, PA-C  [facility]

## 2016-03-07 ENCOUNTER — Other Ambulatory Visit: Payer: Self-pay | Admitting: Physician Assistant

## 2016-03-07 DIAGNOSIS — E041 Nontoxic single thyroid nodule: Secondary | ICD-10-CM | POA: Diagnosis not present

## 2016-03-07 DIAGNOSIS — Z13220 Encounter for screening for lipoid disorders: Secondary | ICD-10-CM | POA: Diagnosis not present

## 2016-03-07 DIAGNOSIS — Z136 Encounter for screening for cardiovascular disorders: Secondary | ICD-10-CM | POA: Diagnosis not present

## 2016-03-07 DIAGNOSIS — Z0000 Encounter for general adult medical examination without abnormal findings: Secondary | ICD-10-CM | POA: Diagnosis not present

## 2016-03-08 ENCOUNTER — Telehealth: Payer: Self-pay

## 2016-03-08 ENCOUNTER — Other Ambulatory Visit: Payer: Self-pay

## 2016-03-08 ENCOUNTER — Encounter: Payer: Self-pay | Admitting: Physician Assistant

## 2016-03-08 DIAGNOSIS — J302 Other seasonal allergic rhinitis: Secondary | ICD-10-CM

## 2016-03-08 LAB — CBC WITH DIFFERENTIAL/PLATELET
Basophils Absolute: 0.1 10*3/uL (ref 0.0–0.2)
Basos: 1 %
EOS (ABSOLUTE): 0.2 10*3/uL (ref 0.0–0.4)
Eos: 3 %
Hematocrit: 42.9 % (ref 34.0–46.6)
Hemoglobin: 14.1 g/dL (ref 11.1–15.9)
Immature Grans (Abs): 0 10*3/uL (ref 0.0–0.1)
Immature Granulocytes: 0 %
Lymphocytes Absolute: 1.9 10*3/uL (ref 0.7–3.1)
Lymphs: 27 %
MCH: 30 pg (ref 26.6–33.0)
MCHC: 32.9 g/dL (ref 31.5–35.7)
MCV: 91 fL (ref 79–97)
Monocytes Absolute: 0.6 10*3/uL (ref 0.1–0.9)
Monocytes: 9 %
Neutrophils Absolute: 4.3 10*3/uL (ref 1.4–7.0)
Neutrophils: 60 %
Platelets: 326 10*3/uL (ref 150–379)
RBC: 4.7 x10E6/uL (ref 3.77–5.28)
RDW: 13.5 % (ref 12.3–15.4)
WBC: 7.2 10*3/uL (ref 3.4–10.8)

## 2016-03-08 LAB — COMPREHENSIVE METABOLIC PANEL
ALT: 15 IU/L (ref 0–32)
AST: 27 IU/L (ref 0–40)
Albumin/Globulin Ratio: 1.7 (ref 1.2–2.2)
Albumin: 4.5 g/dL (ref 3.5–5.5)
Alkaline Phosphatase: 68 IU/L (ref 39–117)
BUN/Creatinine Ratio: 20 (ref 9–23)
BUN: 16 mg/dL (ref 6–24)
Bilirubin Total: 0.3 mg/dL (ref 0.0–1.2)
CO2: 25 mmol/L (ref 18–29)
Calcium: 10.1 mg/dL (ref 8.7–10.2)
Chloride: 98 mmol/L (ref 96–106)
Creatinine, Ser: 0.79 mg/dL (ref 0.57–1.00)
GFR calc Af Amer: 97 mL/min/{1.73_m2} (ref >59)
GFR calc non Af Amer: 84 mL/min/{1.73_m2} (ref >59)
Globulin, Total: 2.6 g/dL (ref 1.5–4.5)
Glucose: 79 mg/dL (ref 65–99)
Potassium: 4 mmol/L (ref 3.5–5.2)
Sodium: 144 mmol/L (ref 134–144)
Total Protein: 7.1 g/dL (ref 6.0–8.5)

## 2016-03-08 LAB — SPECIMEN STATUS REPORT

## 2016-03-08 LAB — LIPID PANEL
Chol/HDL Ratio: 3.1 ratio units (ref 0.0–4.4)
Cholesterol, Total: 179 mg/dL (ref 100–199)
HDL: 58 mg/dL (ref >39)
LDL Calculated: 103 mg/dL — ABNORMAL HIGH (ref 0–99)
Triglycerides: 92 mg/dL (ref 0–149)
VLDL Cholesterol Cal: 18 mg/dL (ref 5–40)

## 2016-03-08 LAB — HEMOGLOBIN A1C
Est. average glucose Bld gHb Est-mCnc: 108 mg/dL
Hgb A1c MFr Bld: 5.4 % (ref 4.8–5.6)

## 2016-03-08 LAB — TSH: TSH: 2.94 u[IU]/mL (ref 0.450–4.500)

## 2016-03-08 MED ORDER — LEVOTHYROXINE SODIUM 100 MCG PO TABS: 100.0000 ug | ORAL_TABLET | Freq: Every day | ORAL | Status: DC

## 2016-03-08 MED ORDER — MONTELUKAST SODIUM 10 MG PO TABS: 10.0000 mg | ORAL_TABLET | Freq: Every day | ORAL | Status: DC

## 2016-03-08 NOTE — Telephone Encounter (Signed)
-----   Message from Mar Daring, PA-C sent at 03/08/2016  8:51 AM EDT ----- All labs are within normal limits and stable.  HgBA1c improved from 5.7 to 5.4 so congratulations! Keep up the good work. Metabolic panel has not yet resulted so once I receive those I will inform you of results. Thanks! -JB

## 2016-03-08 NOTE — Telephone Encounter (Signed)
Can we call [facility] to make sure that they have it.

## 2016-03-08 NOTE — Telephone Encounter (Signed)
Patient advised as directed below. Verbalized understanding. She reports that [facility] told her that they have not receive the prescription for the montelukast and synthroid. I told her that it was sent on 06/15 and receipt confirmed by pharmacy.  Thanks,   -Eliese Kerwood

## 2016-03-08 NOTE — Telephone Encounter (Signed)
Confirmed with Tawanna Sat and advised pharmacy that patient can have Rx filled as generic.

## 2016-03-08 NOTE — Telephone Encounter (Signed)
Pt states [facility] is still stating they have not received [phone]

## 2016-03-08 NOTE — Telephone Encounter (Signed)
Spoke with Lashandra she said that with the montelukast it was ok to do the generic but not for the thyroid medication. Called [facility] pharmacy again and it got fixed.   Thanks,  -Jisell Majer

## 2016-03-11 ENCOUNTER — Telehealth: Payer: Self-pay

## 2016-03-11 NOTE — Telephone Encounter (Signed)
-----   Message from Mar Daring, [place] sent at 03/08/2016  4:47 PM EDT ----- CMP was completely normal.

## 2016-03-11 NOTE — Telephone Encounter (Signed)
LMTCB  Thanks,  -Joseline 

## 2016-04-10 DIAGNOSIS — L718 Other rosacea: Secondary | ICD-10-CM | POA: Diagnosis not present

## 2016-04-10 DIAGNOSIS — L821 Other seborrheic keratosis: Secondary | ICD-10-CM | POA: Diagnosis not present

## 2016-04-10 DIAGNOSIS — L820 Inflamed seborrheic keratosis: Secondary | ICD-10-CM | POA: Diagnosis not present

## 2016-05-03 DIAGNOSIS — H25813 Combined forms of age-related cataract, bilateral: Secondary | ICD-10-CM | POA: Diagnosis not present

## 2016-07-04 DIAGNOSIS — H353131 Nonexudative age-related macular degeneration, bilateral, early dry stage: Secondary | ICD-10-CM | POA: Diagnosis not present

## 2016-11-29 DIAGNOSIS — L718 Other rosacea: Secondary | ICD-10-CM | POA: Diagnosis not present

## 2016-11-29 DIAGNOSIS — L739 Follicular disorder, unspecified: Secondary | ICD-10-CM | POA: Diagnosis not present

## 2016-11-29 DIAGNOSIS — L820 Inflamed seborrheic keratosis: Secondary | ICD-10-CM | POA: Diagnosis not present

## 2016-11-29 DIAGNOSIS — L821 Other seborrheic keratosis: Secondary | ICD-10-CM | POA: Diagnosis not present

## 2016-11-29 DIAGNOSIS — D229 Melanocytic nevi, unspecified: Secondary | ICD-10-CM | POA: Diagnosis not present

## 2017-03-12 ENCOUNTER — Encounter: Payer: Self-pay | Admitting: Physician Assistant

## 2017-03-12 ENCOUNTER — Ambulatory Visit (INDEPENDENT_AMBULATORY_CARE_PROVIDER_SITE_OTHER): Payer: BLUE CROSS/BLUE SHIELD | Admitting: Physician Assistant

## 2017-03-12 VITALS — BP 100/60 | HR 68 | Temp 98.3°F | Resp 16 | Ht 67.0 in | Wt 166.8 lb

## 2017-03-12 DIAGNOSIS — Z1231 Encounter for screening mammogram for malignant neoplasm of breast: Secondary | ICD-10-CM | POA: Diagnosis not present

## 2017-03-12 DIAGNOSIS — L237 Allergic contact dermatitis due to plants, except food: Secondary | ICD-10-CM

## 2017-03-12 DIAGNOSIS — E559 Vitamin D deficiency, unspecified: Secondary | ICD-10-CM

## 2017-03-12 DIAGNOSIS — E041 Nontoxic single thyroid nodule: Secondary | ICD-10-CM

## 2017-03-12 DIAGNOSIS — Z0000 Encounter for general adult medical examination without abnormal findings: Secondary | ICD-10-CM

## 2017-03-12 DIAGNOSIS — L255 Unspecified contact dermatitis due to plants, except food: Secondary | ICD-10-CM

## 2017-03-12 DIAGNOSIS — Z6826 Body mass index (BMI) 26.0-26.9, adult: Secondary | ICD-10-CM | POA: Diagnosis not present

## 2017-03-12 DIAGNOSIS — E039 Hypothyroidism, unspecified: Secondary | ICD-10-CM | POA: Diagnosis not present

## 2017-03-12 DIAGNOSIS — Z1239 Encounter for other screening for malignant neoplasm of breast: Secondary | ICD-10-CM

## 2017-03-12 MED ORDER — PREDNISONE 10 MG PO TABS: ORAL_TABLET | ORAL | 0 refills | Status: DC

## 2017-03-12 NOTE — Progress Notes (Signed)
Patient: Yolanda Fox, Female    DOB: 10/20/1960, 56 y.o.   MRN: [id number] Visit Date: 03/12/2017  Today's Provider: Mar Daring, PA-C   Chief Complaint  Patient presents with  . Annual Exam   Subjective:    Annual physical exam Yolanda Fox is a 56 y.o. female who presents today for health maintenance and complete physical. She feels fairly well. She reports exercising 3-4 days a week. She reports she is sleeping well. 03/06/16 CPE 03/01/15 Pap-neg; HPV-neg; Patient has had to have polyps removed the last 2 pap smears.  03/10/15 Mammogram-normal 07/08/13 Colonoscopy-polyps, recheck in 5 yrs. Dr. Vira Agar -----------------------------------------------------------------  Patient C/O rash x's 3 days on arm and legs. Patient reports she was out in her garden over the weekend and reports getting in contact with a plant on Sunday, rash started Monday.    Review of Systems  Constitutional: Negative.   HENT: Negative.   Eyes: Negative.   Respiratory: Negative.   Cardiovascular: Negative.   Gastrointestinal: Negative.   Endocrine: Positive for cold intolerance.  Genitourinary: Negative.   Musculoskeletal: Negative.   Skin: Positive for rash.  Allergic/Immunologic: Negative.   Neurological: Negative.   Hematological: Negative.   Psychiatric/Behavioral: Negative.     Social History      She  reports that she has never smoked. She has never used smokeless tobacco. She reports that she drinks alcohol. She reports that she does not use drugs.       Social History   Social History  . Marital status: Married    Spouse name: N/A  . Number of children: N/A  . Years of education: N/A   Social History Main Topics  . Smoking status: Never Smoker  . Smokeless tobacco: Never Used  . Alcohol use 0.0 oz/week     Comment: occassionally  . Drug use: No  . Sexual activity: Not Asked   Other Topics Concern  . None   Social History Narrative  . None    Past  Medical History:  Diagnosis Date  . Acute pharyngitis   . Benign neoplasm of colon   . Bicornuate uterus   . Degeneration of intervertebral disc, site unspecified   . Esophageal reflux   . External thrombosed hemorrhoids   . Hematuria, unspecified   . Nontoxic uninodular goiter   . Osteoarthrosis, unspecified whether generalized or localized, unspecified site   . Unspecified adjustment reaction   . Unspecified hypothyroidism   . Unspecified vitamin D deficiency      Patient Active Problem List   Diagnosis Date Noted  . Vitamin D deficiency 03/06/2016  . RAD (reactive airway disease) 03/06/2016  . Arthritis, degenerative 03/06/2016  . Adult hypothyroidism 03/06/2016  . Hot flash, menopausal 03/06/2016  . Acid reflux 03/06/2016  . Narrowing of intervertebral disc space 03/06/2016  . Benign neoplasm of colon 03/06/2016  . Cervical polyp 03/06/2016  . Bradycardia 03/06/2016  . Bicornate uterus 03/06/2016  . AB (asthmatic bronchitis) 03/06/2016  . Allergic rhinitis 03/06/2016  . Adaptation reaction 03/06/2016  . Thyroid nodule 03/01/2015  . Chest pain 01/23/2012  . Dyspnea 01/23/2012  . Sinus bradycardia 01/23/2012  . External hemorrhoid, thrombosed 06/18/2006    Past Surgical History:  Procedure Laterality Date  . THYROIDECTOMY, PARTIAL  1991  . TONSILLECTOMY  1985    Family History        Family Status  Relation Status  . Mother Deceased       rheumatoid arthritis, peptic  ulcer, gastroesophageal reflux disease, emphysema, hiatal hernia, gallbladder disease, colon polyps  . Father Deceased       allergies, hypertension, arthritis, coronary artery disease, renal disease, alcoholism, colon polyps  . Sister Alive       back surgery  . Brother Alive       back problems  . Brother Alive       diabetes mellitus type II, Hypertension,back problems  . Sister Deceased       mva        Her family history includes Heart disease in her father.     Allergies  Allergen  Reactions  . Codeine   . Penicillins      Current Outpatient Prescriptions:  .  aspirin 81 MG tablet, Take by mouth 3 (three) times a week. , Disp: , Rfl:  .  fluticasone (FLONASE) 50 MCG/ACT nasal spray, Place into both nostrils at bedtime as needed for allergies or rhinitis., Disp: , Rfl:  .  levothyroxine (SYNTHROID) 100 MCG tablet, Take 1 tablet (100 mcg total) by mouth daily before breakfast., Disp: 90 tablet, Rfl: 3 .  loratadine (CLARITIN) 10 MG tablet, Take 10 mg by mouth daily., Disp: , Rfl:  .  Magnesium 250 MG TABS, Take 1 tablet by mouth daily., Disp: , Rfl:  .  montelukast (SINGULAIR) 10 MG tablet, Take 1 tablet (10 mg total) by mouth at bedtime., Disp: 90 tablet, Rfl: 3 .  Multiple Vitamin (MULTIVITAMIN) capsule, Take 1 capsule by mouth daily., Disp: , Rfl:  .  Potassium 99 MG TABS, Take 1 tablet by mouth daily., Disp: , Rfl:    Patient Care Team: Mar Daring, PA-C as PCP - General (Family Medicine)      Objective:   Vitals: BP 100/60 (BP Location: Left Arm, Patient Position: Sitting, Cuff Size: Large)   Pulse 68   Temp 98.3 F (36.8 C) (Oral)   Resp 16   Ht 5\' 7"  (1.702 m)   Wt 166 lb 12.8 oz (75.7 kg)   BMI 26.12 kg/m    Vitals:   03/12/17 0917  BP: 100/60  Pulse: 68  Resp: 16  Temp: 98.3 F (36.8 C)  TempSrc: Oral  Weight: 166 lb 12.8 oz (75.7 kg)  Height: 5\' 7"  (1.702 m)     Physical Exam  Constitutional: She is oriented to person, place, and time. She appears well-developed and well-nourished. No distress.  HENT:  Head: Normocephalic and atraumatic.  Right Ear: Hearing, tympanic membrane, external ear and ear canal normal.  Left Ear: Hearing, tympanic membrane, external ear and ear canal normal.  Nose: Nose normal.  Mouth/Throat: Uvula is midline, oropharynx is clear and moist and mucous membranes are normal. No oropharyngeal exudate.  Eyes: Conjunctivae and EOM are normal. Pupils are equal, round, and reactive to light. Right eye  exhibits no discharge. Left eye exhibits no discharge. No scleral icterus.  Neck: Normal range of motion. Neck supple. No JVD present. Carotid bruit is not present. No tracheal deviation present. No thyromegaly present.  Cardiovascular: Normal rate, regular rhythm, normal heart sounds and intact distal pulses.  Exam reveals no gallop and no friction rub.   No murmur heard. Pulmonary/Chest: Effort normal and breath sounds normal. No respiratory distress. She has no wheezes. She has no rales. She exhibits no tenderness. Right breast exhibits no inverted nipple, no mass, no nipple discharge, no skin change and no tenderness. Left breast exhibits no inverted nipple, no mass, no nipple discharge, no skin change and no  tenderness. Breasts are symmetrical.  Abdominal: Soft. Bowel sounds are normal. She exhibits no distension and no mass. There is no tenderness. There is no rebound and no guarding.  Musculoskeletal: Normal range of motion. She exhibits no edema or tenderness.  Lymphadenopathy:    She has no cervical adenopathy.  Neurological: She is alert and oriented to person, place, and time.  Skin: Skin is warm and dry. Rash noted. Rash is vesicular. She is not diaphoretic. There is erythema.     Psychiatric: She has a normal mood and affect. Her behavior is normal. Judgment and thought content normal.  Vitals reviewed.    Depression Screen PHQ 2/9 Scores 03/12/2017 03/01/2015  PHQ - 2 Score 0 0  PHQ- 9 Score 2 -      Assessment & Plan:     Routine Health Maintenance and Physical Exam  Exercise Activities and Dietary recommendations Goals    None       There is no immunization history on file for this patient.  Health Maintenance  Topic Date Due  . HIV Screening  07/24/1974  . TETANUS/TDAP  07/24/1978  . MAMMOGRAM  03/12/2017  . INFLUENZA VACCINE  04/16/2017  . PAP SMEAR  02/28/2018  . COLONOSCOPY  07/08/2018  . Hepatitis C Screening  Completed     Discussed health  benefits of physical activity, and encouraged her to engage in regular exercise appropriate for her age and condition.    1. Annual physical exam Normal physical exam today. Will check labs as below and f/u pending lab results. If labs are stable and WNL she will not need to have these rechecked for one year at her next annual physical exam. She is to call the office in the meantime if she has any acute issue, questions or concerns. - CBC w/Diff/Platelet - Comprehensive Metabolic Panel (CMET) - Lipid Profile  2. Breast cancer screening Breast exam today was normal. There is no family history of breast cancer. She does perform regular self breast exams. Mammogram was ordered as below. Information for [facility] clinic was given to patient so she may schedule her mammogram at her convenience. - MM Digital Screening; Future  3. Adult hypothyroidism S/P right partial thyroidectomy and known left inferior thyroid nodule. Will check labs as below and f/u pending results. [place] ordered for left thyroid nodule below. Last [place] was in 2016 and was fairly stable from previous US in 2014.  - TSH  4. Vitamin D deficiency Will check labs as below and f/u pending results. - Vitamin D (25 hydroxy)  5. BMI 26.0-26.9,adult Counseled patient on healthy lifestyle modifications including dieting and exercise.   6. Thyroid nodule See above medical treatment plan for #3.  - TSH - US Soft Tissue Head/Neck; Future  7. Rhus dermatitis Worsening rash most likely due to [employer]. Prednisone 12 day taper given as below due to severity of rash. She is to call if no improvement.  - predniSONE (DELTASONE) 10 MG tablet; Take 6 tabs PO on day 1&2, 5 tabs PO on day 3&4, 4 tabs PO on day 5&6, 3 tabs PO on day 7&8, 2 tabs PO on day 9&10, 1 tab PO on day 11&12.  Dispense: 42 tablet; Refill: 0  --------------------------------------------------------------------    Mar Daring, PA-C  [facility]

## 2017-03-12 NOTE — Patient Instructions (Signed)

## 2017-03-13 ENCOUNTER — Telehealth: Payer: Self-pay

## 2017-03-13 LAB — CBC WITH DIFFERENTIAL/PLATELET
Basophils Absolute: 0 10*3/uL (ref 0.0–0.2)
Basos: 0 %
EOS (ABSOLUTE): 0.4 10*3/uL (ref 0.0–0.4)
Eos: 4 %
Hematocrit: 41.5 % (ref 34.0–46.6)
Hemoglobin: 13.7 g/dL (ref 11.1–15.9)
Immature Grans (Abs): 0 10*3/uL (ref 0.0–0.1)
Immature Granulocytes: 0 %
Lymphocytes Absolute: 2 10*3/uL (ref 0.7–3.1)
Lymphs: 23 %
MCH: 29.6 pg (ref 26.6–33.0)
MCHC: 33 g/dL (ref 31.5–35.7)
MCV: 90 fL (ref 79–97)
Monocytes Absolute: 0.7 10*3/uL (ref 0.1–0.9)
Monocytes: 8 %
Neutrophils Absolute: 5.7 10*3/uL (ref 1.4–7.0)
Neutrophils: 65 %
Platelets: 325 10*3/uL (ref 150–379)
RBC: 4.63 x10E6/uL (ref 3.77–5.28)
RDW: 13.5 % (ref 12.3–15.4)
WBC: 8.9 10*3/uL (ref 3.4–10.8)

## 2017-03-13 LAB — COMPREHENSIVE METABOLIC PANEL
ALT: 15 IU/L (ref 0–32)
AST: 18 IU/L (ref 0–40)
Albumin/Globulin Ratio: 2.1 (ref 1.2–2.2)
Albumin: 4.5 g/dL (ref 3.5–5.5)
Alkaline Phosphatase: 67 IU/L (ref 39–117)
BUN/Creatinine Ratio: 13 (ref 9–23)
BUN: 11 mg/dL (ref 6–24)
Bilirubin Total: 0.4 mg/dL (ref 0.0–1.2)
CO2: 25 mmol/L (ref 20–29)
Calcium: 9.5 mg/dL (ref 8.7–10.2)
Chloride: 104 mmol/L (ref 96–106)
Creatinine, Ser: 0.85 mg/dL (ref 0.57–1.00)
GFR calc Af Amer: 88 mL/min/{1.73_m2} (ref >59)
GFR calc non Af Amer: 76 mL/min/{1.73_m2} (ref >59)
Globulin, Total: 2.1 g/dL (ref 1.5–4.5)
Glucose: 86 mg/dL (ref 65–99)
Potassium: 5 mmol/L (ref 3.5–5.2)
Sodium: 144 mmol/L (ref 134–144)
Total Protein: 6.6 g/dL (ref 6.0–8.5)

## 2017-03-13 LAB — LIPID PANEL
Chol/HDL Ratio: 3.5 ratio (ref 0.0–4.4)
Cholesterol, Total: 169 mg/dL (ref 100–199)
HDL: 48 mg/dL (ref >39)
LDL Calculated: 89 mg/dL (ref 0–99)
Triglycerides: 158 mg/dL — ABNORMAL HIGH (ref 0–149)
VLDL Cholesterol Cal: 32 mg/dL (ref 5–40)

## 2017-03-13 LAB — TSH: TSH: 2.33 u[IU]/mL (ref 0.450–4.500)

## 2017-03-13 LAB — VITAMIN D 25 HYDROXY (VIT D DEFICIENCY, FRACTURES): Vit D, 25-Hydroxy: 43.3 ng/mL (ref 30.0–100.0)

## 2017-03-13 NOTE — Telephone Encounter (Signed)
-----   Message from Mar Daring, PA-C sent at 03/13/2017  8:18 AM EDT ----- All labs are within normal limits and stable.  Thanks! -JB

## 2017-03-13 NOTE — Telephone Encounter (Signed)
Patient advised.

## 2017-03-18 ENCOUNTER — Ambulatory Visit
Admission: RE | Admit: 2017-03-18 | Discharge: 2017-03-18 | Disposition: A | Discharge status: Home / Self Care | Payer: BLUE CROSS/BLUE SHIELD | Source: Ambulatory Visit | Attending: Physician Assistant | Admitting: Physician Assistant

## 2017-03-18 DIAGNOSIS — E890 Postprocedural hypothyroidism: Secondary | ICD-10-CM | POA: Diagnosis not present

## 2017-03-18 DIAGNOSIS — E041 Nontoxic single thyroid nodule: Secondary | ICD-10-CM

## 2017-03-18 DIAGNOSIS — Z9889 Other specified postprocedural states: Secondary | ICD-10-CM | POA: Diagnosis not present

## 2017-03-26 ENCOUNTER — Other Ambulatory Visit: Payer: Self-pay | Admitting: Physician Assistant

## 2017-03-26 DIAGNOSIS — E039 Hypothyroidism, unspecified: Secondary | ICD-10-CM

## 2017-03-26 DIAGNOSIS — J302 Other seasonal allergic rhinitis: Secondary | ICD-10-CM

## 2017-03-31 ENCOUNTER — Ambulatory Visit
Admission: RE | Admit: 2017-03-31 | Discharge: 2017-03-31 | Disposition: A | Discharge status: Home / Self Care | Payer: BLUE CROSS/BLUE SHIELD | Source: Ambulatory Visit | Attending: Physician Assistant | Admitting: Physician Assistant

## 2017-03-31 DIAGNOSIS — Z1231 Encounter for screening mammogram for malignant neoplasm of breast: Secondary | ICD-10-CM | POA: Diagnosis not present

## 2017-03-31 DIAGNOSIS — Z1239 Encounter for other screening for malignant neoplasm of breast: Secondary | ICD-10-CM

## 2017-04-02 ENCOUNTER — Other Ambulatory Visit: Payer: Self-pay | Admitting: *Deleted

## 2017-04-02 ENCOUNTER — Inpatient Hospital Stay
Admission: RE | Admit: 2017-04-02 | Discharge: 2017-04-02 | Disposition: A | Discharge status: Home / Self Care | Payer: Self-pay | Source: Ambulatory Visit | Attending: *Deleted | Admitting: *Deleted

## 2017-04-02 DIAGNOSIS — Z9289 Personal history of other medical treatment: Secondary | ICD-10-CM

## 2017-06-13 ENCOUNTER — Encounter: Payer: Self-pay | Admitting: Physician Assistant

## 2017-06-13 DIAGNOSIS — M62830 Muscle spasm of back: Secondary | ICD-10-CM

## 2017-06-13 MED ORDER — CYCLOBENZAPRINE HCL 5 MG PO TABS: 5.0000 mg | ORAL_TABLET | Freq: Every day | ORAL | 1 refills | Status: DC

## 2017-09-30 ENCOUNTER — Other Ambulatory Visit: Payer: Self-pay | Admitting: Physician Assistant

## 2017-09-30 DIAGNOSIS — J302 Other seasonal allergic rhinitis: Secondary | ICD-10-CM

## 2017-09-30 DIAGNOSIS — E039 Hypothyroidism, unspecified: Secondary | ICD-10-CM

## 2017-12-01 DIAGNOSIS — L719 Rosacea, unspecified: Secondary | ICD-10-CM | POA: Diagnosis not present

## 2017-12-01 DIAGNOSIS — L821 Other seborrheic keratosis: Secondary | ICD-10-CM | POA: Diagnosis not present

## 2018-03-13 ENCOUNTER — Encounter: Payer: Self-pay | Admitting: Physician Assistant

## 2018-03-13 ENCOUNTER — Telehealth: Payer: Self-pay

## 2018-03-13 ENCOUNTER — Ambulatory Visit (INDEPENDENT_AMBULATORY_CARE_PROVIDER_SITE_OTHER): Payer: BLUE CROSS/BLUE SHIELD | Admitting: Physician Assistant

## 2018-03-13 VITALS — BP 110/70 | HR 67 | Temp 97.9°F | Resp 16 | Ht 67.0 in | Wt 157.0 lb

## 2018-03-13 DIAGNOSIS — N841 Polyp of cervix uteri: Secondary | ICD-10-CM | POA: Diagnosis not present

## 2018-03-13 DIAGNOSIS — E039 Hypothyroidism, unspecified: Secondary | ICD-10-CM | POA: Diagnosis not present

## 2018-03-13 DIAGNOSIS — E559 Vitamin D deficiency, unspecified: Secondary | ICD-10-CM

## 2018-03-13 DIAGNOSIS — R1310 Dysphagia, unspecified: Secondary | ICD-10-CM | POA: Diagnosis not present

## 2018-03-13 DIAGNOSIS — R7309 Other abnormal glucose: Secondary | ICD-10-CM | POA: Diagnosis not present

## 2018-03-13 DIAGNOSIS — Z114 Encounter for screening for human immunodeficiency virus [HIV]: Secondary | ICD-10-CM

## 2018-03-13 DIAGNOSIS — Z0000 Encounter for general adult medical examination without abnormal findings: Secondary | ICD-10-CM

## 2018-03-13 DIAGNOSIS — Z1231 Encounter for screening mammogram for malignant neoplasm of breast: Secondary | ICD-10-CM | POA: Diagnosis not present

## 2018-03-13 DIAGNOSIS — Z1211 Encounter for screening for malignant neoplasm of colon: Secondary | ICD-10-CM

## 2018-03-13 DIAGNOSIS — Z124 Encounter for screening for malignant neoplasm of cervix: Secondary | ICD-10-CM | POA: Diagnosis not present

## 2018-03-13 DIAGNOSIS — F4320 Adjustment disorder, unspecified: Secondary | ICD-10-CM | POA: Diagnosis not present

## 2018-03-13 DIAGNOSIS — Z1239 Encounter for other screening for malignant neoplasm of breast: Secondary | ICD-10-CM

## 2018-03-13 NOTE — Telephone Encounter (Signed)
Patient would like Dr. Tiffany Kocher to do her colonoscopy as she was a former patient of his. Could you change this in her referral? Thanks!

## 2018-03-13 NOTE — Patient Instructions (Signed)
Health Maintenance for Postmenopausal Women Menopause is a normal process in which your reproductive ability comes to an end. This process happens gradually over a span of months to years, usually between the ages of 22 and 9. Menopause is complete when you have missed 12 consecutive menstrual periods. It is important to talk with your health care provider about some of the most common conditions that affect postmenopausal women, such as heart disease, cancer, and bone loss (osteoporosis). Adopting a healthy lifestyle and getting preventive care can help to promote your health and wellness. Those actions can also lower your chances of developing some of these common conditions. What should I know about menopause? During menopause, you may experience a number of symptoms, such as:  Moderate-to-severe hot flashes.  Night sweats.  Decrease in sex drive.  Mood swings.  Headaches.  Tiredness.  Irritability.  Memory problems.  Insomnia.  Choosing to treat or not to treat menopausal changes is an individual decision that you make with your health care provider. What should I know about hormone replacement therapy and supplements? Hormone therapy products are effective for treating symptoms that are associated with menopause, such as hot flashes and night sweats. Hormone replacement carries certain risks, especially as you become older. If you are thinking about using estrogen or estrogen with progestin treatments, discuss the benefits and risks with your health care provider. What should I know about heart disease and stroke? Heart disease, heart attack, and stroke become more likely as you age. This may be due, in part, to the hormonal changes that your body experiences during menopause. These can affect how your body processes dietary fats, triglycerides, and cholesterol. Heart attack and stroke are both medical emergencies. There are many things that you can do to help prevent heart disease  and stroke:  Have your blood pressure checked at least every 1-2 years. High blood pressure causes heart disease and increases the risk of stroke.  If you are 53-22 years old, ask your health care provider if you should take aspirin to prevent a heart attack or a stroke.  Do not use any tobacco products, including cigarettes, chewing tobacco, or electronic cigarettes. If you need help quitting, ask your health care provider.  It is important to eat a healthy diet and maintain a healthy weight. ? Be sure to include plenty of vegetables, fruits, low-fat dairy products, and lean protein. ? Avoid eating foods that are high in solid fats, added sugars, or salt (sodium).  Get regular exercise. This is one of the most important things that you can do for your health. ? Try to exercise for at least 150 minutes each week. The type of exercise that you do should increase your heart rate and make you sweat. This is known as moderate-intensity exercise. ? Try to do strengthening exercises at least twice each week. Do these in addition to the moderate-intensity exercise.  Know your numbers.Ask your health care provider to check your cholesterol and your blood glucose. Continue to have your blood tested as directed by your health care provider.  What should I know about cancer screening? There are several types of cancer. Take the following steps to reduce your risk and to catch any cancer development as early as possible. Breast Cancer  Practice breast self-awareness. ? This means understanding how your breasts normally appear and feel. ? It also means doing regular breast self-exams. Let your health care provider know about any changes, no matter how small.  If you are 40  or older, have a clinician do a breast exam (clinical breast exam or CBE) every year. Depending on your age, family history, and medical history, it may be recommended that you also have a yearly breast X-ray (mammogram).  If you  have a family history of breast cancer, talk with your health care provider about genetic screening.  If you are at high risk for breast cancer, talk with your health care provider about having an MRI and a mammogram every year.  Breast cancer (BRCA) gene test is recommended for women who have family members with BRCA-related cancers. Results of the assessment will determine the need for genetic counseling and BRCA1 and for BRCA2 testing. BRCA-related cancers include these types: ? Breast. This occurs in males or females. ? Ovarian. ? Tubal. This may also be called fallopian tube cancer. ? Cancer of the abdominal or pelvic lining (peritoneal cancer). ? Prostate. ? Pancreatic.  Cervical, Uterine, and Ovarian Cancer Your health care provider may recommend that you be screened regularly for cancer of the pelvic organs. These include your ovaries, uterus, and vagina. This screening involves a pelvic exam, which includes checking for microscopic changes to the surface of your cervix (Pap test).  For women ages 21-65, health care providers may recommend a pelvic exam and a Pap test every three years. For women ages 79-65, they may recommend the Pap test and pelvic exam, combined with testing for human papilloma virus (HPV), every five years. Some types of HPV increase your risk of cervical cancer. Testing for HPV may also be done on women of any age who have unclear Pap test results.  Other health care providers may not recommend any screening for nonpregnant women who are considered low risk for pelvic cancer and have no symptoms. Ask your health care provider if a screening pelvic exam is right for you.  If you have had past treatment for cervical cancer or a condition that could lead to cancer, you need Pap tests and screening for cancer for at least 20 years after your treatment. If Pap tests have been discontinued for you, your risk factors (such as having a new sexual partner) need to be  reassessed to determine if you should start having screenings again. Some women have medical problems that increase the chance of getting cervical cancer. In these cases, your health care provider may recommend that you have screening and Pap tests more often.  If you have a family history of uterine cancer or ovarian cancer, talk with your health care provider about genetic screening.  If you have vaginal bleeding after reaching menopause, tell your health care provider.  There are currently no reliable tests available to screen for ovarian cancer.  Lung Cancer Lung cancer screening is recommended for adults 69-62 years old who are at high risk for lung cancer because of a history of smoking. A yearly low-dose CT scan of the lungs is recommended if you:  Currently smoke.  Have a history of at least 30 pack-years of smoking and you currently smoke or have quit within the past 15 years. A pack-year is smoking an average of one pack of cigarettes per day for one year.  Yearly screening should:  Continue until it has been 15 years since you quit.  Stop if you develop a health problem that would prevent you from having lung cancer treatment.  Colorectal Cancer  This type of cancer can be detected and can often be prevented.  Routine colorectal cancer screening usually begins at  age 42 and continues through age 45.  If you have risk factors for colon cancer, your health care provider may recommend that you be screened at an earlier age.  If you have a family history of colorectal cancer, talk with your health care provider about genetic screening.  Your health care provider may also recommend using home test kits to check for hidden blood in your stool.  A small camera at the end of a tube can be used to examine your colon directly (sigmoidoscopy or colonoscopy). This is done to check for the earliest forms of colorectal cancer.  Direct examination of the colon should be repeated every  5-10 years until age 71. However, if early forms of precancerous polyps or small growths are found or if you have a family history or genetic risk for colorectal cancer, you may need to be screened more often.  Skin Cancer  Check your skin from head to toe regularly.  Monitor any moles. Be sure to tell your health care provider: ? About any new moles or changes in moles, especially if there is a change in a mole's shape or color. ? If you have a mole that is larger than the size of a pencil eraser.  If any of your family members has a history of skin cancer, especially at a young age, talk with your health care provider about genetic screening.  Always use sunscreen. Apply sunscreen liberally and repeatedly throughout the day.  Whenever you are outside, protect yourself by wearing long sleeves, pants, a wide-brimmed hat, and sunglasses.  What should I know about osteoporosis? Osteoporosis is a condition in which bone destruction happens more quickly than new bone creation. After menopause, you may be at an increased risk for osteoporosis. To help prevent osteoporosis or the bone fractures that can happen because of osteoporosis, the following is recommended:  If you are 46-71 years old, get at least 1,000 mg of calcium and at least 600 mg of vitamin D per day.  If you are older than age 55 but younger than age 65, get at least 1,200 mg of calcium and at least 600 mg of vitamin D per day.  If you are older than age 54, get at least 1,200 mg of calcium and at least 800 mg of vitamin D per day.  Smoking and excessive alcohol intake increase the risk of osteoporosis. Eat foods that are rich in calcium and vitamin D, and do weight-bearing exercises several times each week as directed by your health care provider. What should I know about how menopause affects my mental health? Depression may occur at any age, but it is more common as you become older. Common symptoms of depression  include:  Low or sad mood.  Changes in sleep patterns.  Changes in appetite or eating patterns.  Feeling an overall lack of motivation or enjoyment of activities that you previously enjoyed.  Frequent crying spells.  Talk with your health care provider if you think that you are experiencing depression. What should I know about immunizations? It is important that you get and maintain your immunizations. These include:  Tetanus, diphtheria, and pertussis (Tdap) booster vaccine.  Influenza every year before the flu season begins.  Pneumonia vaccine.  Shingles vaccine.  Your health care provider may also recommend other immunizations. This information is not intended to replace advice given to you by your health care provider. Make sure you discuss any questions you have with your health care provider. Document Released: 10/25/2005  Document Revised: 03/22/2016 Document Reviewed: 06/06/2015 [employer]vier Interactive Patient Education  2018 [employer]  

## 2018-03-13 NOTE — Progress Notes (Signed)
Patient: Yolanda Fox, Female    DOB: 08-17-1961, 57 y.o.   MRN: [id number] Visit Date: 03/13/2018  Today's Provider: Mar Daring, PA-C   Chief Complaint  Patient presents with  . Annual Exam   Subjective:    Annual physical exam Yolanda Fox is a 57 y.o. female who presents today for health maintenance and complete physical. She feels well. She reports exercising 4 days week. She reports she is sleeping well.  03/02/17 CPE 03/01/15 Pap-neg HPV-neg 04/02/17 Mammogram-BI-RADS 1 07/08/13 Colonoscopy-Polyps, recheck 5 years -----------------------------------------------------------------   Review of Systems  Constitutional: Positive for chills and fatigue.  HENT: Positive for drooling, hearing loss, sinus pressure and trouble swallowing.   Eyes: Negative.   Respiratory: Positive for shortness of breath.   Cardiovascular: Negative.   Gastrointestinal: Negative.   Endocrine: Positive for cold intolerance.  Genitourinary: Positive for urgency and vaginal pain.  Musculoskeletal: Positive for back pain.  Skin: Negative.   Allergic/Immunologic: Negative.   Neurological: Negative.   Hematological: Negative.   Psychiatric/Behavioral: Positive for decreased concentration.    Social History      She  reports that she has never smoked. She has never used smokeless tobacco. She reports that she drinks alcohol. She reports that she does not use drugs.       Social History   Socioeconomic History  . Marital status: Married    Spouse name: Not on file  . Number of children: Not on file  . Years of education: Not on file  . Highest education level: Not on file  Occupational History  . Not on file  Social Needs  . Financial resource strain: Not on file  . Food insecurity:    Worry: Not on file    Inability: Not on file  . Transportation needs:    Medical: Not on file    Non-medical: Not on file  Tobacco Use  . Smoking status: Never Smoker  . Smokeless  tobacco: Never Used  Substance and Sexual Activity  . Alcohol use: Yes    Alcohol/week: 0.0 oz    Comment: occassionally  . Drug use: No  . Sexual activity: Not on file  Lifestyle  . Physical activity:    Days per week: Not on file    Minutes per session: Not on file  . Stress: Not on file  Relationships  . Social connections:    Talks on phone: Not on file    Gets together: Not on file    Attends religious service: Not on file    Active member of club or organization: Not on file    Attends meetings of clubs or organizations: Not on file    Relationship status: Not on file  Other Topics Concern  . Not on file  Social History Narrative  . Not on file    Past Medical History:  Diagnosis Date  . Acute pharyngitis   . Benign neoplasm of colon   . Bicornuate uterus   . Degeneration of intervertebral disc, site unspecified   . Esophageal reflux   . External thrombosed hemorrhoids   . Hematuria, unspecified   . Nontoxic uninodular goiter   . Osteoarthrosis, unspecified whether generalized or localized, unspecified site   . Unspecified adjustment reaction   . Unspecified hypothyroidism   . Unspecified vitamin D deficiency      Patient Active Problem List   Diagnosis Date Noted  . Vitamin D deficiency 03/06/2016  . RAD (reactive airway disease) 03/06/2016  .  Arthritis, degenerative 03/06/2016  . Adult hypothyroidism 03/06/2016  . Hot flash, menopausal 03/06/2016  . Acid reflux 03/06/2016  . Narrowing of intervertebral disc space 03/06/2016  . Benign neoplasm of colon 03/06/2016  . Cervical polyp 03/06/2016  . Bradycardia 03/06/2016  . Bicornate uterus 03/06/2016  . AB (asthmatic bronchitis) 03/06/2016  . Allergic rhinitis 03/06/2016  . Adaptation reaction 03/06/2016  . Thyroid nodule 03/01/2015  . Chest pain 01/23/2012  . Dyspnea 01/23/2012  . Sinus bradycardia 01/23/2012  . External hemorrhoid, thrombosed 06/18/2006    Past Surgical History:  Procedure  Laterality Date  . THYROIDECTOMY, PARTIAL  1991  . [place] History        Family Status  Relation Name Status  . Mother  Deceased       rheumatoid arthritis, peptic ulcer, gastroesophageal reflux disease, emphysema, hiatal hernia, gallbladder disease, colon polyps  . Father  Deceased       allergies, hypertension, arthritis, coronary artery disease, renal disease, alcoholism, colon polyps  . Sister  Alive       back surgery  . Brother  Alive       back problems  . Brother  Alive       diabetes mellitus type II, Hypertension,back problems  . Sister  Deceased       mva  . Neg Hx  (Not Specified)        Her family history includes Heart disease in her father. There is no history of Breast cancer.      Allergies  Allergen Reactions  . Codeine   . Penicillins      Current Outpatient Medications:  .  aspirin 81 MG tablet, Take by mouth 3 (three) times a week. , Disp: , Rfl:  .  cyclobenzaprine (FLEXERIL) 5 MG tablet, Take 1 tablet (5 mg total) by mouth at bedtime. (Patient taking differently: Take 5 mg by mouth 3 (three) times daily as needed. ), Disp: 30 tablet, Rfl: 1 .  fluticasone (FLONASE) 50 MCG/ACT nasal spray, Place into both nostrils at bedtime as needed for allergies or rhinitis., Disp: , Rfl:  .  levothyroxine (SYNTHROID, LEVOTHROID) 100 MCG tablet, TAKE 1 TABLET BY MOUTH DAILY BEFORE BREAKFAST-GENERIC FOR SYNTHROID-, Disp: 90 tablet, Rfl: 1 .  loratadine (CLARITIN) 10 MG tablet, Take 10 mg by mouth daily., Disp: , Rfl:  .  montelukast (SINGULAIR) 10 MG tablet, TAKE 1 TABLET BY MOUTH AT BEDTIME, Disp: 90 tablet, Rfl: 1 .  Multiple Vitamin (MULTIVITAMIN) capsule, Take 1 capsule by mouth daily., Disp: , Rfl:  .  SOOLANTRA 1 % CREA, Apply A thin coat TO THE entire FACE EVERY NIGHT AT BEDTIME., Disp: , Rfl: 3   Patient Care Team: Mar Daring, PA-C as PCP - General (Family Medicine)      Objective:   Vitals: BP 110/70 (BP Location: Left  Arm, Patient Position: Sitting, Cuff Size: Normal)   Pulse 67   Temp 97.9 F (36.6 C) (Oral)   Resp 16   Ht 5\' 7"  (1.702 m)   Wt 157 lb (71.2 kg)   SpO2 97%   BMI 24.59 kg/m    Vitals:   03/13/18 0924  BP: 110/70  Pulse: 67  Resp: 16  Temp: 97.9 F (36.6 C)  TempSrc: Oral  SpO2: 97%  Weight: 157 lb (71.2 kg)  Height: 5\' 7"  (1.702 m)     Physical Exam  Constitutional: She is oriented to person, place, and time. She appears well-developed and  well-nourished. No distress.  HENT:  Head: Normocephalic and atraumatic.  Right Ear: Hearing, tympanic membrane, external ear and ear canal normal.  Left Ear: Hearing, tympanic membrane, external ear and ear canal normal.  Nose: Nose normal.  Mouth/Throat: Uvula is midline, oropharynx is clear and moist and mucous membranes are normal. No oropharyngeal exudate.  Eyes: Pupils are equal, round, and reactive to light. Conjunctivae and EOM are normal. Right eye exhibits no discharge. Left eye exhibits no discharge. No scleral icterus.  Neck: Normal range of motion. Neck supple. No JVD present. Carotid bruit is not present. No tracheal deviation present. No thyromegaly present.  Cardiovascular: Normal rate, regular rhythm, normal heart sounds and intact distal pulses. Exam reveals no gallop and no friction rub.  No murmur heard. Pulmonary/Chest: Effort normal and breath sounds normal. No respiratory distress. She has no wheezes. She has no rales. She exhibits no tenderness. Right breast exhibits no inverted nipple, no mass, no nipple discharge, no skin change and no tenderness. Left breast exhibits no inverted nipple, no mass, no nipple discharge, no skin change and no tenderness. No breast swelling, tenderness, discharge or bleeding. Breasts are symmetrical.  Abdominal: Soft. Bowel sounds are normal. She exhibits no distension and no mass. There is no tenderness. There is no rebound and no guarding. Hernia confirmed negative in the right inguinal  area and confirmed negative in the left inguinal area.  Genitourinary: Rectum normal, vagina normal and uterus normal. Pelvic exam was performed with patient supine. There is no rash, tenderness, lesion or injury on the right labia. There is no rash, tenderness, lesion or injury on the left labia. Cervix exhibits no motion tenderness, no discharge and no friability. Right adnexum displays no mass, no tenderness and no fullness. Left adnexum displays no mass, no tenderness and no fullness. No erythema, tenderness or bleeding in the vagina. No signs of injury around the vagina. No vaginal discharge found.    Musculoskeletal: Normal range of motion. She exhibits no edema or tenderness.  Lymphadenopathy:    She has no cervical adenopathy.       Right: No inguinal adenopathy present.       Left: No inguinal adenopathy present.  Neurological: She is alert and oriented to person, place, and time. She has normal reflexes. No cranial nerve deficit. Coordination normal.  Skin: Skin is warm and dry. No rash noted. She is not diaphoretic.  Psychiatric: She has a normal mood and affect. Her behavior is normal. Judgment and thought content normal.  Vitals reviewed.   Depression Screen PHQ 2/9 Scores 03/13/2018 03/12/2017 03/01/2015  PHQ - 2 Score 0 0 0  PHQ- 9 Score 2 2 -      Assessment & Plan:     Routine Health Maintenance and Physical Exam  Exercise Activities and Dietary recommendations Goals    None      Immunization History  Administered Date(s) Administered  . Influenza-Unspecified 07/15/2017    Health Maintenance  Topic Date Due  . HIV Screening  07/24/1974  . TETANUS/TDAP  07/24/1978  . PAP SMEAR  02/28/2018  . INFLUENZA VACCINE  04/16/2018  . COLONOSCOPY  07/08/2018  . MAMMOGRAM  04/01/2019  . Hepatitis C Screening  Completed     Discussed health benefits of physical activity, and encouraged her to engage in regular exercise appropriate for her age and condition.    1.  Annual physical exam Normal physical exam today. Will check labs as below and f/u pending lab results. If labs are stable  and WNL she will not need to have these rechecked for one year at her next annual physical exam. She is to call the office in the meantime if she has any acute issue, questions or concerns. - CBC w/Diff/Platelet - Comprehensive Metabolic Panel (CMET) - TSH - Lipid Profile - HgB A1c  2. Breast cancer screening Breast exam today was normal. There is no family history of breast cancer. She does perform regular self breast exams. Mammogram was ordered as below. Information for [facility] was given to patient so she may schedule her mammogram at her convenience. - MM Digital Screening; Future  3. Cervical cancer screening Pap collected today. Will send as below and f/u pending results. - Pap IG and HPV (high risk) DNA detection  4. Colon cancer screening - Ambulatory referral to Gastroenterology  5. Adult hypothyroidism Stable. Continue levothyroxine 122mcg. Will check labs as below and f/u pending results. - CBC w/Diff/Platelet - Comprehensive Metabolic Panel (CMET) - TSH  6. Adjustment disorder, unspecified type Stable. Will check labs as below and f/u pending results. - CBC w/Diff/Platelet - Comprehensive Metabolic Panel (CMET)  7. Vitamin D deficiency H/O this. Will check labs as below and f/u pending results. - CBC w/Diff/Platelet - Comprehensive Metabolic Panel (CMET)  8. Dysphagia, unspecified type Needs EGD as well as colonoscopy due to worsening dysphagia. Will check labs as below and f/u pending results. - Comprehensive Metabolic Panel (CMET) - Ambulatory referral to Gastroenterology  9. Screening for HIV without presence of risk factors - HIV antibody (with reflex)  10. Cervical polyp Stable cervical polyp noted.   --------------------------------------------------------------------    Mar Daring, PA-C  [facility]

## 2018-03-13 NOTE — Telephone Encounter (Signed)
done

## 2018-03-14 LAB — CBC WITH DIFFERENTIAL/PLATELET
Basophils Absolute: 0.1 10*3/uL (ref 0.0–0.2)
Basos: 1 %
EOS (ABSOLUTE): 0.2 10*3/uL (ref 0.0–0.4)
Eos: 3 %
Hematocrit: 42.3 % (ref 34.0–46.6)
Hemoglobin: 13.5 g/dL (ref 11.1–15.9)
Immature Grans (Abs): 0 10*3/uL (ref 0.0–0.1)
Immature Granulocytes: 0 %
Lymphocytes Absolute: 1.9 10*3/uL (ref 0.7–3.1)
Lymphs: 25 %
MCH: 29.1 pg (ref 26.6–33.0)
MCHC: 31.9 g/dL (ref 31.5–35.7)
MCV: 91 fL (ref 79–97)
Monocytes Absolute: 0.7 10*3/uL (ref 0.1–0.9)
Monocytes: 9 %
Neutrophils Absolute: 4.6 10*3/uL (ref 1.4–7.0)
Neutrophils: 62 %
Platelets: 342 10*3/uL (ref 150–450)
RBC: 4.64 x10E6/uL (ref 3.77–5.28)
RDW: 13.3 % (ref 12.3–15.4)
WBC: 7.5 10*3/uL (ref 3.4–10.8)

## 2018-03-14 LAB — COMPREHENSIVE METABOLIC PANEL
ALT: 16 IU/L (ref 0–32)
AST: 17 IU/L (ref 0–40)
Albumin/Globulin Ratio: 2.1 (ref 1.2–2.2)
Albumin: 4.5 g/dL (ref 3.5–5.5)
Alkaline Phosphatase: 68 IU/L (ref 39–117)
BUN/Creatinine Ratio: 15 (ref 9–23)
BUN: 11 mg/dL (ref 6–24)
Bilirubin Total: 0.4 mg/dL (ref 0.0–1.2)
CO2: 26 mmol/L (ref 20–29)
Calcium: 9.5 mg/dL (ref 8.7–10.2)
Chloride: 104 mmol/L (ref 96–106)
Creatinine, Ser: 0.71 mg/dL (ref 0.57–1.00)
GFR calc Af Amer: 109 mL/min/{1.73_m2} (ref >59)
GFR calc non Af Amer: 94 mL/min/{1.73_m2} (ref >59)
Globulin, Total: 2.1 g/dL (ref 1.5–4.5)
Glucose: 82 mg/dL (ref 65–99)
Potassium: 4.5 mmol/L (ref 3.5–5.2)
Sodium: 143 mmol/L (ref 134–144)
Total Protein: 6.6 g/dL (ref 6.0–8.5)

## 2018-03-14 LAB — HEMOGLOBIN A1C
Est. average glucose Bld gHb Est-mCnc: 108 mg/dL
Hgb A1c MFr Bld: 5.4 % (ref 4.8–5.6)

## 2018-03-14 LAB — LIPID PANEL
Chol/HDL Ratio: 3.2 ratio (ref 0.0–4.4)
Cholesterol, Total: 155 mg/dL (ref 100–199)
HDL: 48 mg/dL (ref >39)
LDL Calculated: 86 mg/dL (ref 0–99)
Triglycerides: 107 mg/dL (ref 0–149)
VLDL Cholesterol Cal: 21 mg/dL (ref 5–40)

## 2018-03-14 LAB — HIV ANTIBODY (ROUTINE TESTING W REFLEX): HIV Screen 4th Generation wRfx: NONREACTIVE

## 2018-03-14 LAB — TSH: TSH: 0.846 u[IU]/mL (ref 0.450–4.500)

## 2018-03-16 ENCOUNTER — Telehealth: Payer: Self-pay

## 2018-03-16 NOTE — Telephone Encounter (Signed)
Patient advised as below.  

## 2018-03-16 NOTE — Telephone Encounter (Signed)
-----   Message from Mar Daring, PA-C sent at 03/14/2018  9:31 AM EDT ----- All labs are within normal limits and stable.  Pap is pending. Thanks! -JB

## 2018-03-18 ENCOUNTER — Telehealth: Payer: Self-pay

## 2018-03-18 ENCOUNTER — Encounter: Payer: Self-pay | Admitting: Physician Assistant

## 2018-03-18 LAB — PAP IG AND HPV HIGH-RISK
HPV, high-risk: NEGATIVE
PAP Smear Comment: 0

## 2018-03-18 NOTE — Telephone Encounter (Signed)
Patient advised as directed below.  Thanks,  -Joseline 

## 2018-03-18 NOTE — Telephone Encounter (Signed)
-----   Message from Mar Daring, PA-C sent at 03/18/2018  8:46 AM EDT ----- Pap is normal, HPV negative.  Will repeat in 3-5 years.

## 2018-04-04 ENCOUNTER — Other Ambulatory Visit: Payer: Self-pay | Admitting: Physician Assistant

## 2018-04-04 DIAGNOSIS — J302 Other seasonal allergic rhinitis: Secondary | ICD-10-CM

## 2018-04-04 DIAGNOSIS — E039 Hypothyroidism, unspecified: Secondary | ICD-10-CM

## 2018-04-07 DIAGNOSIS — K219 Gastro-esophageal reflux disease without esophagitis: Secondary | ICD-10-CM | POA: Diagnosis not present

## 2018-04-07 DIAGNOSIS — R1310 Dysphagia, unspecified: Secondary | ICD-10-CM | POA: Diagnosis not present

## 2018-04-07 DIAGNOSIS — Z8371 Family history of colonic polyps: Secondary | ICD-10-CM | POA: Diagnosis not present

## 2018-05-13 ENCOUNTER — Ambulatory Visit
Admission: RE | Admit: 2018-05-13 | Discharge: 2018-05-13 | Disposition: A | Discharge status: Home / Self Care | Payer: BLUE CROSS/BLUE SHIELD | Source: Ambulatory Visit | Attending: Physician Assistant | Admitting: Physician Assistant

## 2018-05-13 DIAGNOSIS — Z1239 Encounter for other screening for malignant neoplasm of breast: Secondary | ICD-10-CM

## 2018-05-13 DIAGNOSIS — Z1231 Encounter for screening mammogram for malignant neoplasm of breast: Secondary | ICD-10-CM | POA: Insufficient documentation

## 2018-05-15 ENCOUNTER — Other Ambulatory Visit: Payer: Self-pay | Admitting: Physician Assistant

## 2018-05-15 DIAGNOSIS — R928 Other abnormal and inconclusive findings on diagnostic imaging of breast: Secondary | ICD-10-CM

## 2018-05-22 ENCOUNTER — Ambulatory Visit
Admission: RE | Admit: 2018-05-22 | Discharge: 2018-05-22 | Disposition: A | Discharge status: Home / Self Care | Payer: BLUE CROSS/BLUE SHIELD | Source: Ambulatory Visit | Attending: Physician Assistant | Admitting: Physician Assistant

## 2018-05-22 DIAGNOSIS — R928 Other abnormal and inconclusive findings on diagnostic imaging of breast: Secondary | ICD-10-CM

## 2018-05-22 DIAGNOSIS — R922 Inconclusive mammogram: Secondary | ICD-10-CM | POA: Diagnosis not present

## 2018-06-02 DIAGNOSIS — K222 Esophageal obstruction: Secondary | ICD-10-CM | POA: Diagnosis not present

## 2018-06-02 DIAGNOSIS — K2970 Gastritis, unspecified, without bleeding: Secondary | ICD-10-CM | POA: Diagnosis not present

## 2018-06-02 DIAGNOSIS — R1310 Dysphagia, unspecified: Secondary | ICD-10-CM | POA: Diagnosis not present

## 2018-06-02 DIAGNOSIS — Z1211 Encounter for screening for malignant neoplasm of colon: Secondary | ICD-10-CM | POA: Diagnosis not present

## 2018-06-02 DIAGNOSIS — K219 Gastro-esophageal reflux disease without esophagitis: Secondary | ICD-10-CM | POA: Diagnosis not present

## 2018-06-02 DIAGNOSIS — K2950 Unspecified chronic gastritis without bleeding: Secondary | ICD-10-CM | POA: Diagnosis not present

## 2018-06-02 DIAGNOSIS — Z8371 Family history of colonic polyps: Secondary | ICD-10-CM | POA: Diagnosis not present

## 2018-06-02 DIAGNOSIS — D122 Benign neoplasm of ascending colon: Secondary | ICD-10-CM | POA: Diagnosis not present

## 2018-06-02 LAB — HM COLONOSCOPY

## 2018-06-18 DIAGNOSIS — K2950 Unspecified chronic gastritis without bleeding: Secondary | ICD-10-CM | POA: Diagnosis not present

## 2018-06-18 DIAGNOSIS — R1310 Dysphagia, unspecified: Secondary | ICD-10-CM | POA: Diagnosis not present

## 2018-06-18 DIAGNOSIS — K219 Gastro-esophageal reflux disease without esophagitis: Secondary | ICD-10-CM | POA: Diagnosis not present

## 2018-06-25 ENCOUNTER — Encounter: Payer: Self-pay | Admitting: Physician Assistant

## 2018-06-25 ENCOUNTER — Ambulatory Visit
Admission: RE | Admit: 2018-06-25 | Discharge: 2018-06-25 | Disposition: A | Discharge status: Home / Self Care | Payer: BLUE CROSS/BLUE SHIELD | Source: Ambulatory Visit | Attending: Physician Assistant | Admitting: Physician Assistant

## 2018-06-25 ENCOUNTER — Ambulatory Visit: Payer: BLUE CROSS/BLUE SHIELD | Admitting: Physician Assistant

## 2018-06-25 VITALS — BP 100/60 | HR 110 | Temp 98.5°F | Resp 16 | Ht 67.0 in | Wt 159.4 lb

## 2018-06-25 DIAGNOSIS — M19042 Primary osteoarthritis, left hand: Secondary | ICD-10-CM | POA: Diagnosis not present

## 2018-06-25 DIAGNOSIS — M19049 Primary osteoarthritis, unspecified hand: Secondary | ICD-10-CM

## 2018-06-25 DIAGNOSIS — M79641 Pain in right hand: Secondary | ICD-10-CM | POA: Diagnosis present

## 2018-06-25 DIAGNOSIS — M19041 Primary osteoarthritis, right hand: Secondary | ICD-10-CM | POA: Diagnosis not present

## 2018-06-25 DIAGNOSIS — M79642 Pain in left hand: Secondary | ICD-10-CM | POA: Diagnosis present

## 2018-06-25 MED ORDER — METHYLPREDNISOLONE 4 MG PO TBPK: ORAL_TABLET | ORAL | 0 refills | Status: DC

## 2018-06-25 NOTE — Patient Instructions (Signed)
Thumb splica splint

## 2018-06-25 NOTE — Progress Notes (Signed)
Patient: Yolanda Fox Female    DOB: 1961/08/03   57 y.o.   MRN: [id number] Visit Date: 06/25/2018  Today's Provider: Mar Daring, PA-C   Chief Complaint  Patient presents with  . Hand Pain   Subjective:    Hand Pain   The incident occurred more than 1 week ago (for the past 6 months ago). There was no injury mechanism. The pain is present in the left hand and right hand (She reports that she can't hold her coffee cup, pen or pain. She reports that is maily her thumb left and right, but mostly right.). The quality of the pain is described as aching. The pain does not radiate. Pain scale: with holding or pushing is 8/10. The pain is mild. The pain has been constant since the incident. Pertinent negatives include no numbness. Nothing aggravates the symptoms. Treatments tried: earlier in the summer she put iced it. Aleve prn-but when she takes Aleve or IBU it makes her hemorrhoids bleed and reports that Tylenol constipate her. She has tried topical creams otc.      Allergies  Allergen Reactions  . Codeine   . Penicillins      Current Outpatient Medications:  .  aspirin 81 MG tablet, Take by mouth 3 (three) times a week. , Disp: , Rfl:  .  famotidine (PEPCID) 20 MG tablet, Take by mouth., Disp: , Rfl:  .  fluticasone (FLONASE) 50 MCG/ACT nasal spray, Place into both nostrils at bedtime as needed for allergies or rhinitis., Disp: , Rfl:  .  levothyroxine (SYNTHROID, LEVOTHROID) 100 MCG tablet, TAKE 1 TABLET BY MOUTH DAILY BEFORE BREAKFAST-GENERIC FOR SYNTHROID-, Disp: 90 tablet, Rfl: 1 .  loratadine (CLARITIN) 10 MG tablet, Take 10 mg by mouth daily., Disp: , Rfl:  .  montelukast (SINGULAIR) 10 MG tablet, TAKE 1 TABLET BY MOUTH AT BEDTIME, Disp: 90 tablet, Rfl: 1 .  Multiple Vitamin (MULTIVITAMIN) capsule, Take 1 capsule by mouth daily., Disp: , Rfl:  .  SOOLANTRA 1 % CREA, Apply A thin coat TO THE entire FACE EVERY NIGHT AT BEDTIME., Disp: , Rfl: 3 .  cyclobenzaprine  (FLEXERIL) 5 MG tablet, Take 1 tablet (5 mg total) by mouth at bedtime. (Patient not taking: Reported on 06/25/2018), Disp: 30 tablet, Rfl: 1  Review of Systems  Constitutional: Negative.   Respiratory: Negative.   Cardiovascular: Negative.   Musculoskeletal: Positive for arthralgias.  Neurological: Positive for weakness. Negative for numbness.    Social History   Tobacco Use  . Smoking status: Never Smoker  . Smokeless tobacco: Never Used  Substance Use Topics  . Alcohol use: Yes    Alcohol/week: 0.0 standard drinks    Comment: occassionally   Objective:   BP 100/60 (BP Location: Left Arm, Patient Position: Sitting, Cuff Size: Normal)   Pulse (!) 110   Temp 98.5 F (36.9 C) (Oral)   Resp 16   Ht 5\' 7"  (1.702 m)   Wt 159 lb 6.4 oz (72.3 kg)   SpO2 98%   BMI 24.97 kg/m  Vitals:   06/25/18 0831  BP: 100/60  Pulse: (!) 110  Resp: 16  Temp: 98.5 F (36.9 C)  TempSrc: Oral  SpO2: 98%  Weight: 159 lb 6.4 oz (72.3 kg)  Height: 5\' 7"  (1.702 m)     Physical Exam  Constitutional: She appears well-developed and well-nourished. No distress.  Neck: Normal range of motion. Neck supple.  Cardiovascular: Normal rate, regular rhythm and normal heart  sounds. Exam reveals no gallop and no friction rub.  No murmur heard. Pulmonary/Chest: Effort normal and breath sounds normal. No respiratory distress. She has no wheezes. She has no rales.  Musculoskeletal:       Right hand: She exhibits bony tenderness. She exhibits normal range of motion, normal capillary refill, no deformity and no swelling. Normal sensation noted. Decreased strength noted. She exhibits thumb/finger opposition.       Left hand: She exhibits bony tenderness. She exhibits normal range of motion, normal capillary refill and no swelling. Normal sensation noted. Normal strength noted.  Negative Finklestein test  Skin: She is not diaphoretic.  Vitals reviewed.       Assessment & Plan:     1. CMC  arthritis Worsening pain over 1st CMC joints bilaterally but R>L with decreased grip strength. Will get imaging as below. Discussed adding a thumb spic splint and will do medrol dose pak as below. She is to call ir f/u in 2-4 weeks if no improvement and will consider referring to Dr. Gwyneth Revels.  - DG Hand Complete Right; Future - DG Hand Complete Left; Future - methylPREDNISolone (MEDROL) 4 MG TBPK tablet; 6 day taper; take as directed on package instructions  Dispense: 21 tablet; Refill: 0       Mar Daring, PA-C  [facility]

## 2018-07-17 ENCOUNTER — Encounter: Payer: Self-pay | Admitting: Physician Assistant

## 2018-07-20 ENCOUNTER — Encounter: Payer: Self-pay | Admitting: Physician Assistant

## 2018-07-20 DIAGNOSIS — M180 Bilateral primary osteoarthritis of first carpometacarpal joints: Secondary | ICD-10-CM

## 2018-07-28 DIAGNOSIS — M1811 Unilateral primary osteoarthritis of first carpometacarpal joint, right hand: Secondary | ICD-10-CM | POA: Diagnosis not present

## 2018-09-14 ENCOUNTER — Other Ambulatory Visit: Payer: Self-pay | Admitting: Physician Assistant

## 2018-09-14 DIAGNOSIS — J302 Other seasonal allergic rhinitis: Secondary | ICD-10-CM

## 2018-09-14 MED ORDER — MONTELUKAST SODIUM 10 MG PO TABS: 10.0000 mg | ORAL_TABLET | Freq: Every day | ORAL | 4 refills | Status: DC

## 2018-09-14 NOTE — Telephone Encounter (Signed)
Please advise 

## 2018-09-14 NOTE — Telephone Encounter (Signed)
[facility] faxed refill request for the following medications:  montelukast (SINGULAIR) 10 MG tablet   Qty: 90  This is a [employer] patient.    Please advise.

## 2018-09-17 ENCOUNTER — Other Ambulatory Visit: Payer: Self-pay | Admitting: Physician Assistant

## 2018-09-17 DIAGNOSIS — E039 Hypothyroidism, unspecified: Secondary | ICD-10-CM

## 2018-09-17 MED ORDER — LEVOTHYROXINE SODIUM 100 MCG PO TABS: ORAL_TABLET | ORAL | 0 refills | Status: DC

## 2018-09-17 NOTE — Telephone Encounter (Signed)
[facility] faxed refill request for the following medications:  levothyroxine (SYNTHROID, LEVOTHROID) 100 MCG tablet   Please advise.

## 2018-12-02 DIAGNOSIS — L718 Other rosacea: Secondary | ICD-10-CM | POA: Diagnosis not present

## 2018-12-09 ENCOUNTER — Other Ambulatory Visit: Payer: Self-pay

## 2018-12-09 DIAGNOSIS — E039 Hypothyroidism, unspecified: Secondary | ICD-10-CM

## 2018-12-09 MED ORDER — LEVOTHYROXINE SODIUM 100 MCG PO TABS: ORAL_TABLET | ORAL | 1 refills | Status: DC

## 2019-03-18 ENCOUNTER — Encounter: Payer: Self-pay | Admitting: Physician Assistant

## 2019-03-22 IMAGING — CR DG HAND COMPLETE 3+V*R*
1 series · 3 of 3 positions shown · non-contrast
Comparison: None.

CLINICAL DATA: Pain over the carpal metacarpal articulation for 6
months, no injury

EXAM:
RIGHT HAND - COMPLETE 3+ VIEW

[Series 1: dg hand complete right · 0.14mm/px · 3 of 3 slices shown]
[im 1/3]
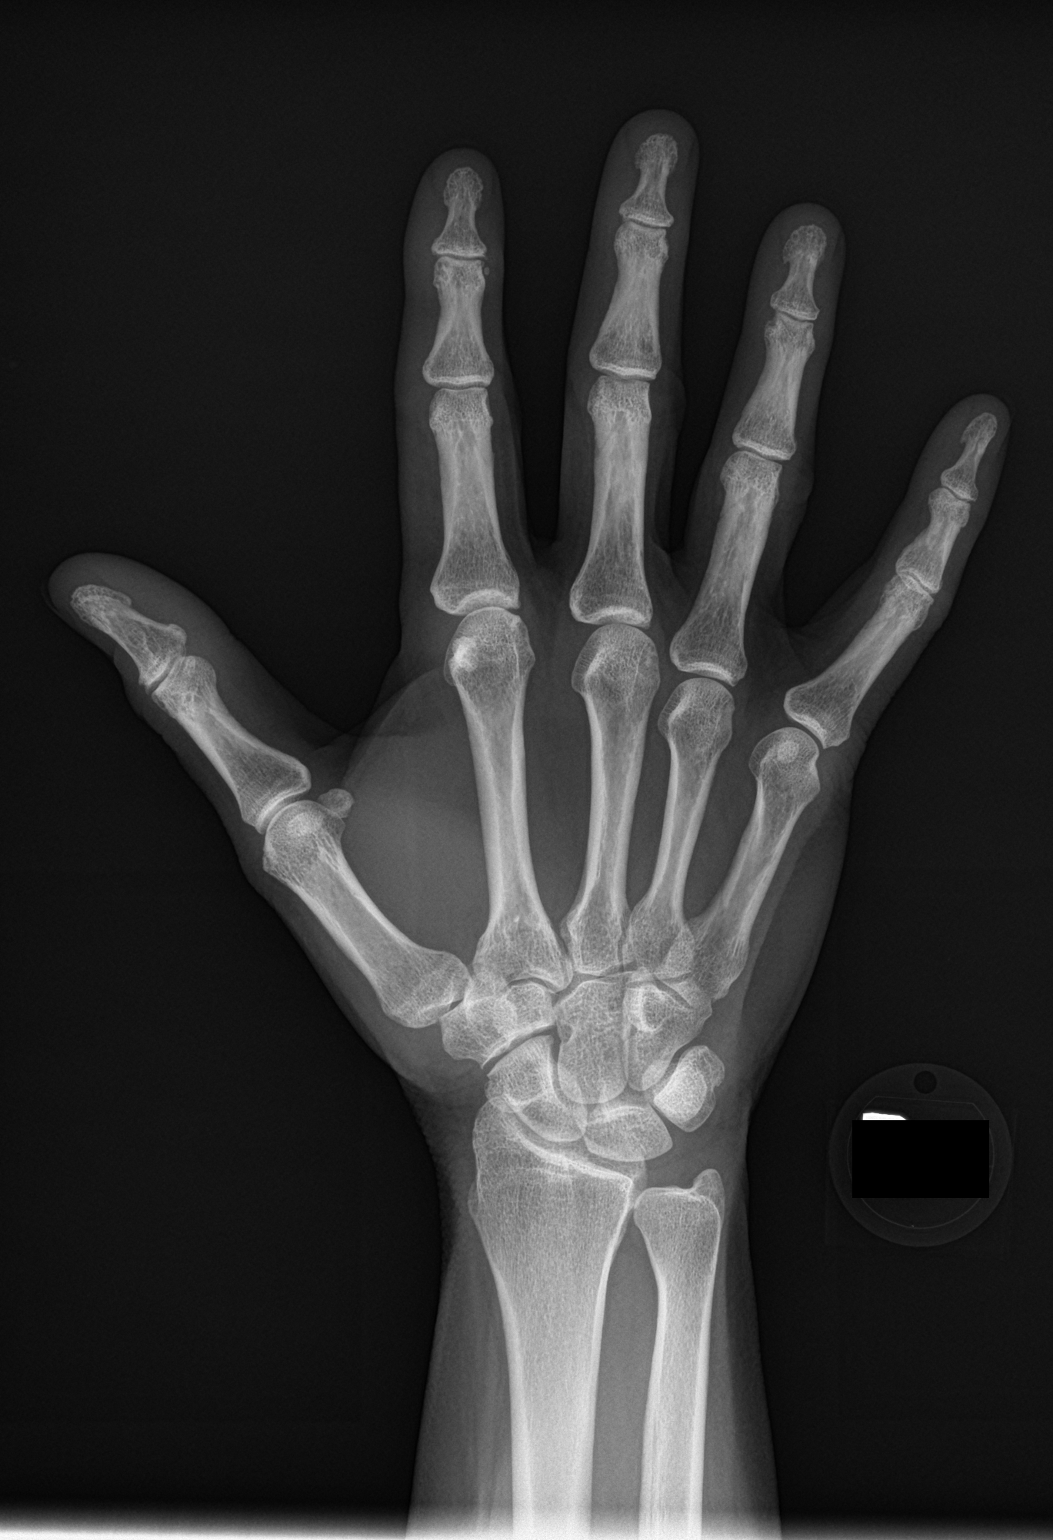
[im 2/3]
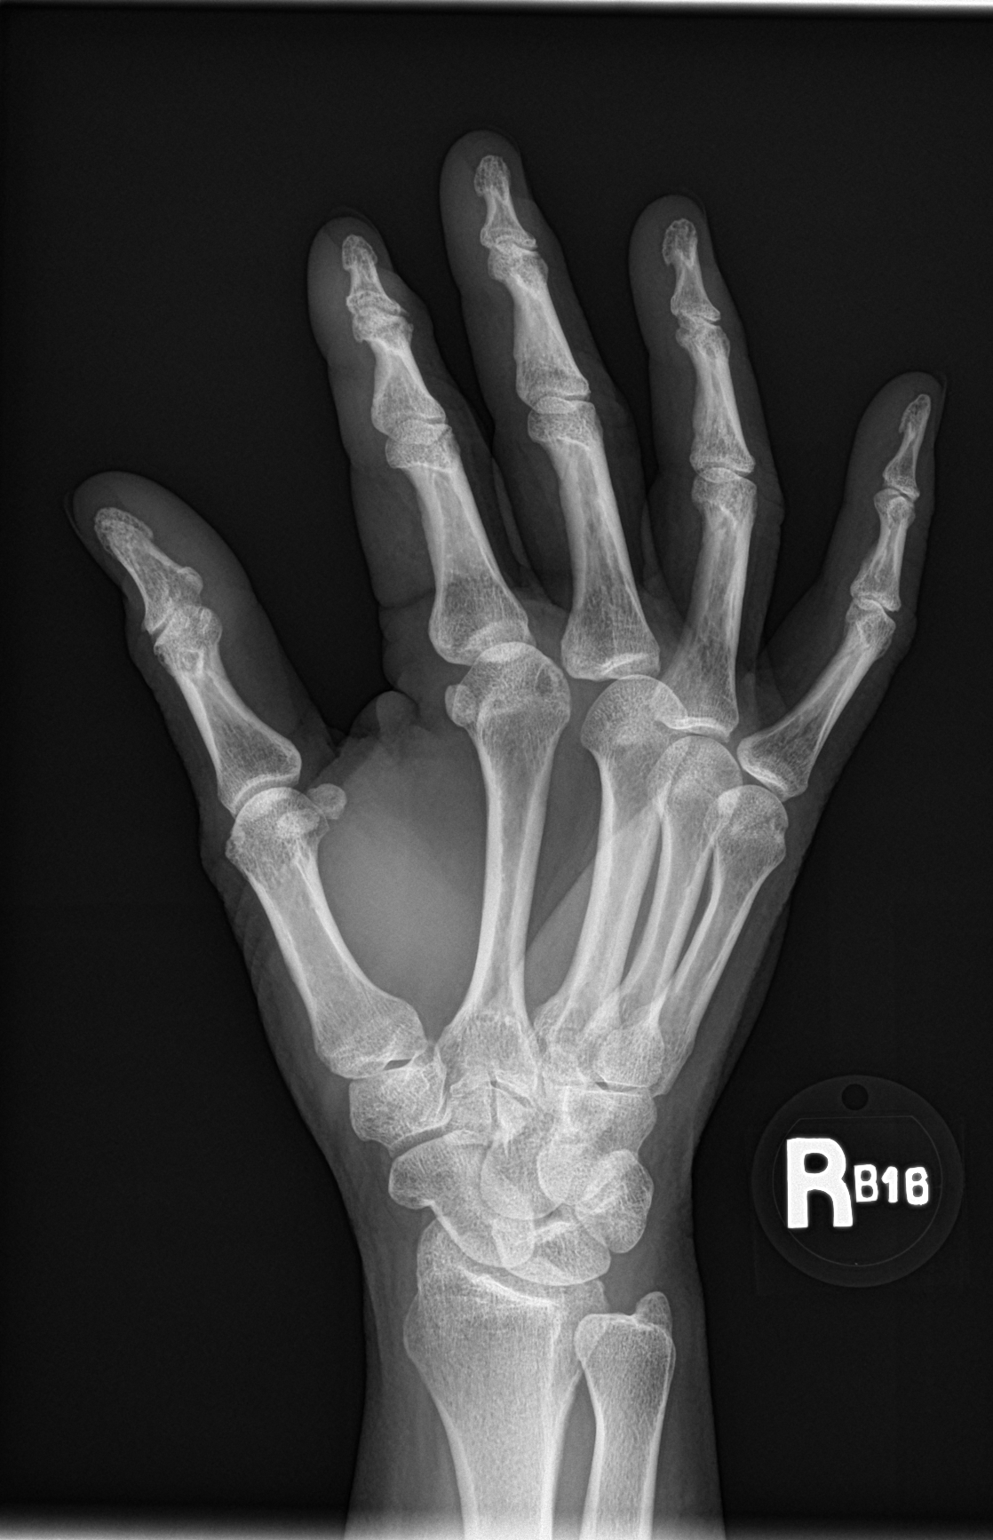
[im 3/3]
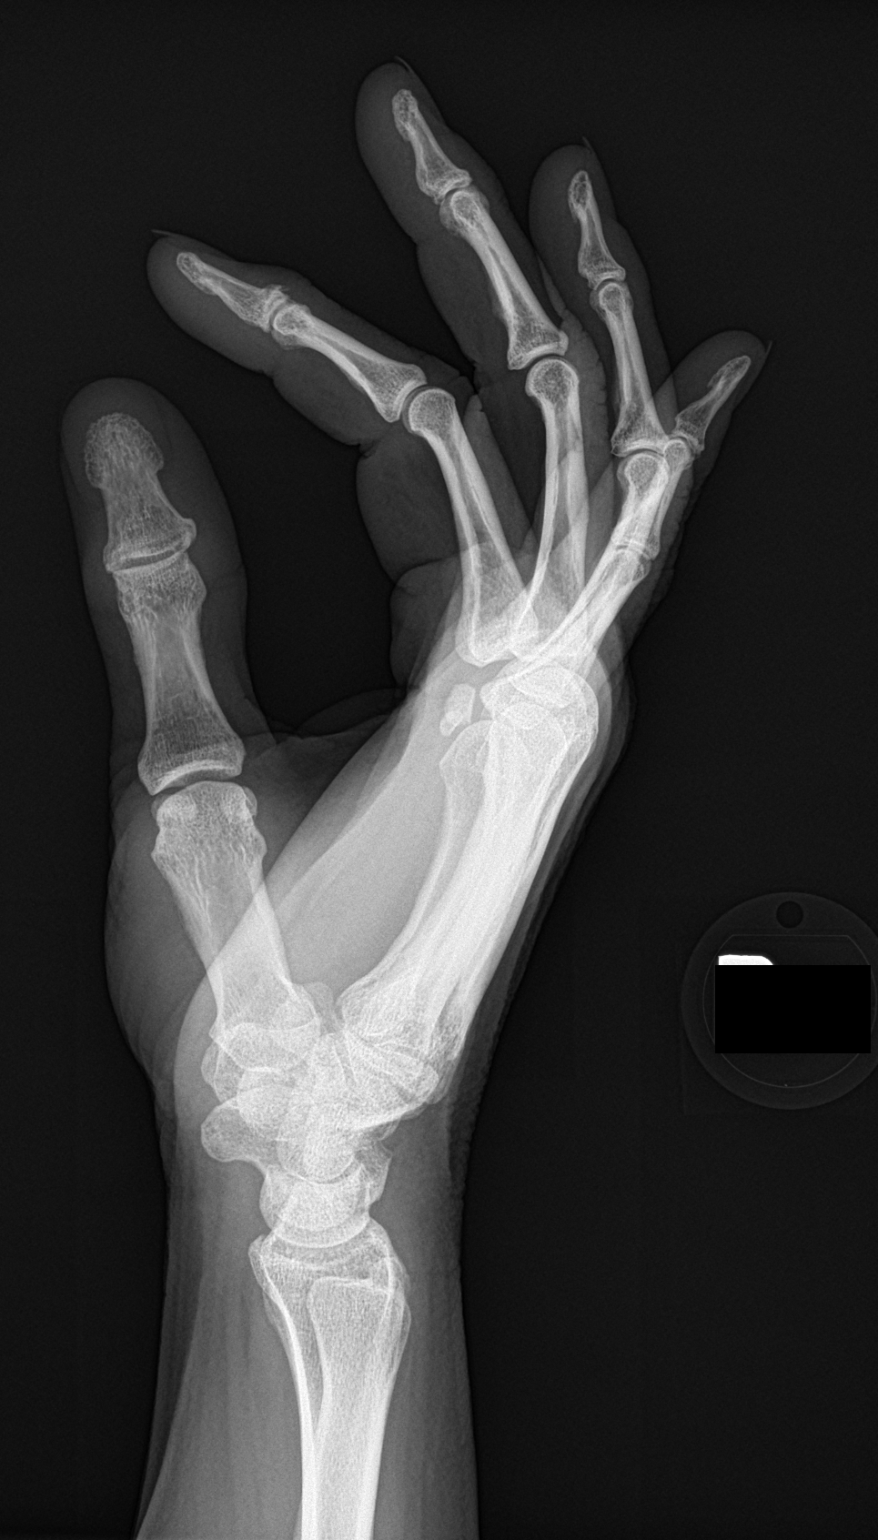

[3 of 3 positions shown; findings below may reference images not displayed]

FINDINGS: The right radiocarpal joint space appears normal and the ulnar
styloid is intact. The carpal bones are normal position. There is
very minimal degenerative joint disease of the right first CMC
joint. MCP PIP joints appear normal. There are mild degenerative
changes of the DIP joints. No erosion is seen.
IMPRESSION: Mild degenerative change of the right first CMC joint and mild
degenerative change of the DIP joints. No erosion.

## 2019-03-22 IMAGING — CR DG HAND COMPLETE 3+V*L*
1 series · 3 of 3 positions shown · non-contrast
Comparison: None.

CLINICAL DATA: Pain over the first CMC joint of both hands for 6
months, no injury

EXAM:
LEFT HAND - COMPLETE 3+ VIEW

[Series 1: dg hand complete left · 0.14mm/px · 3 of 3 slices shown]
[im 1/3]
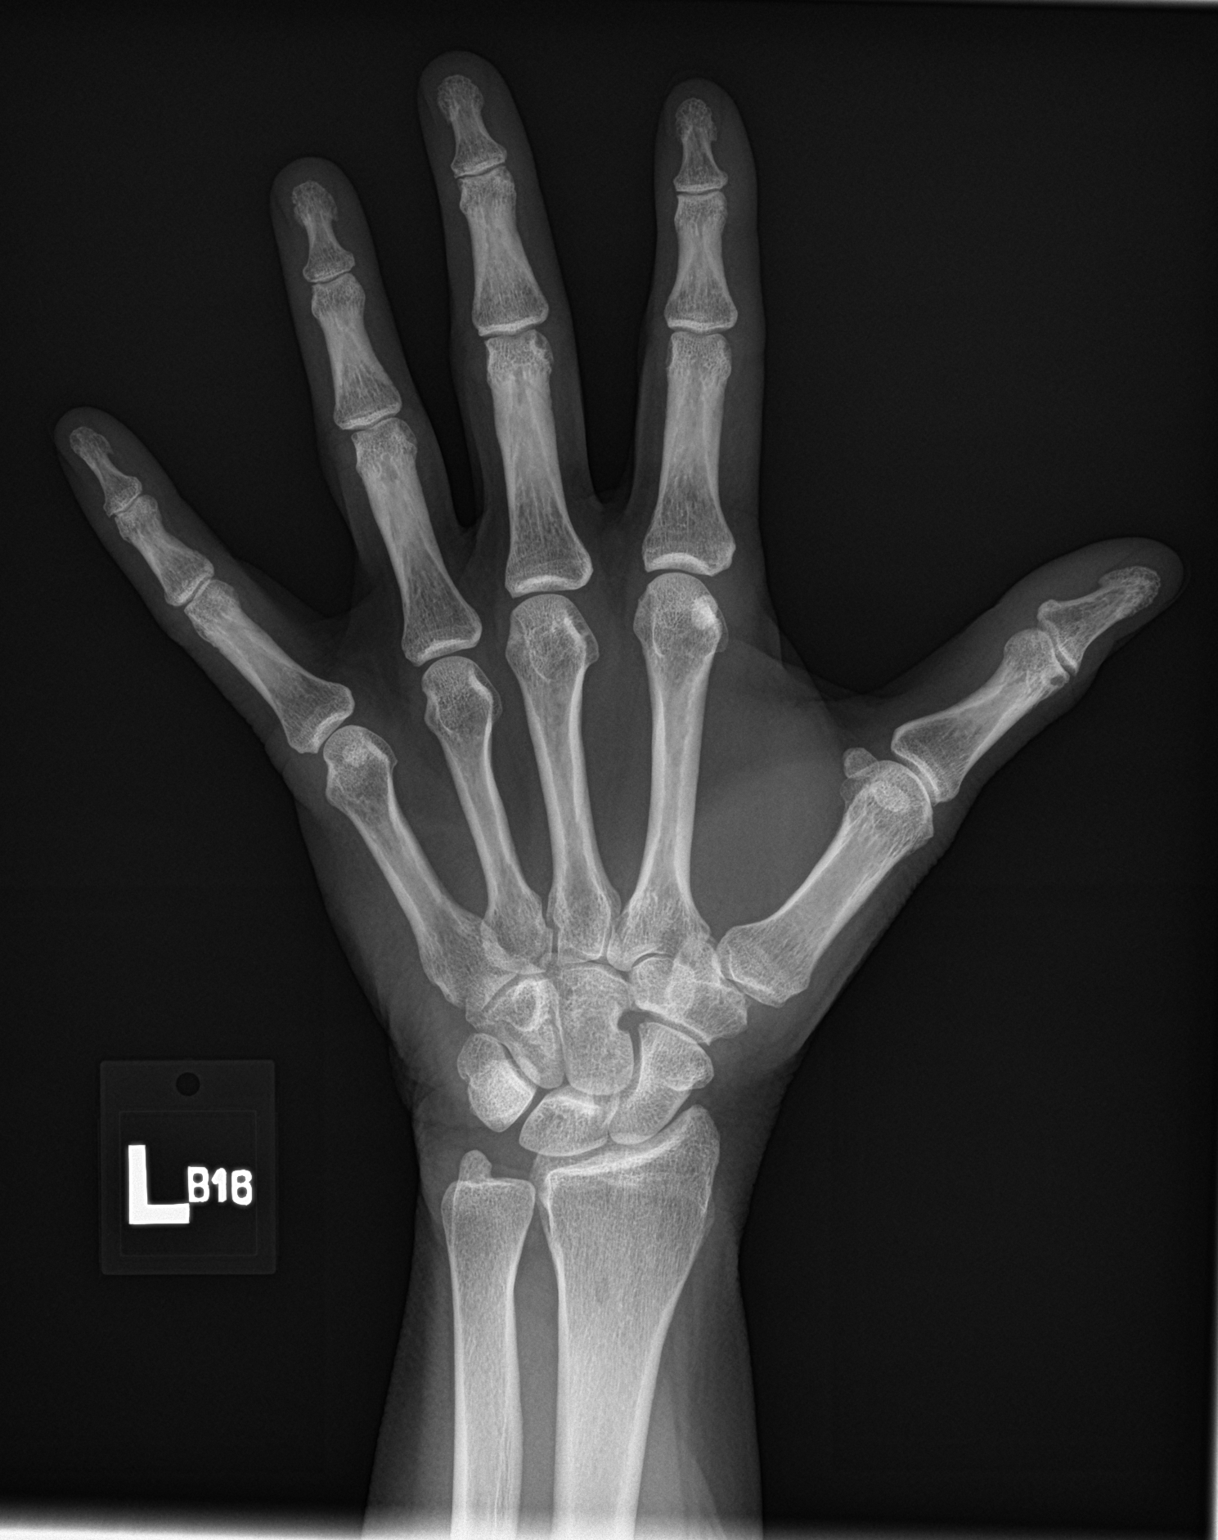
[im 2/3]
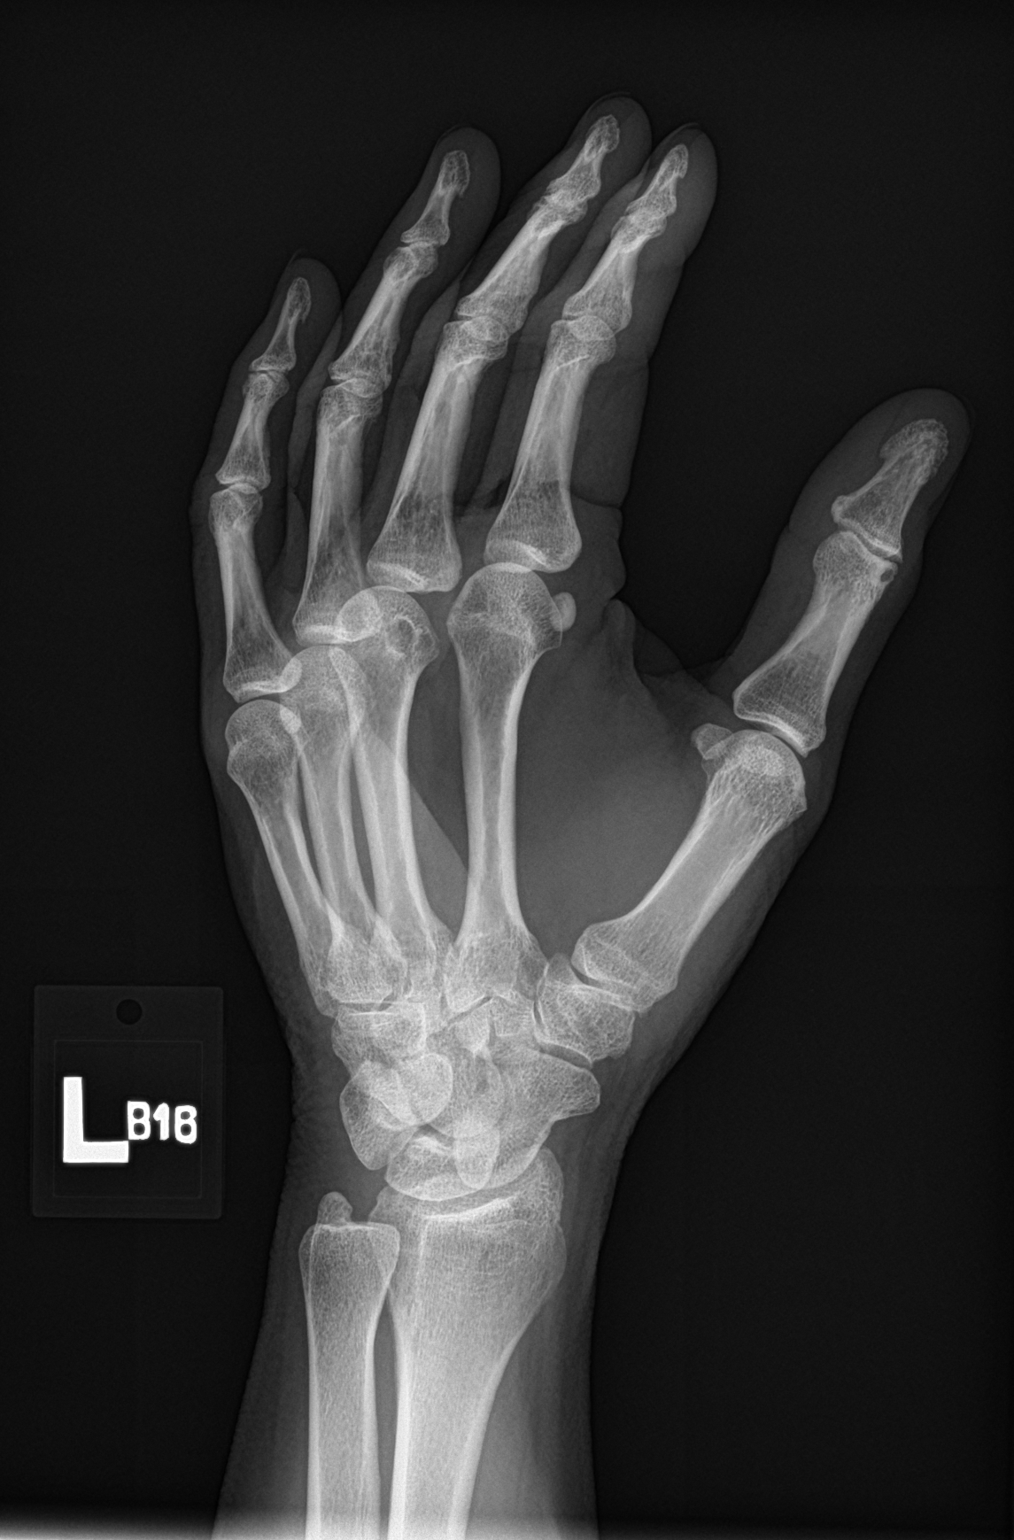
[im 3/3]
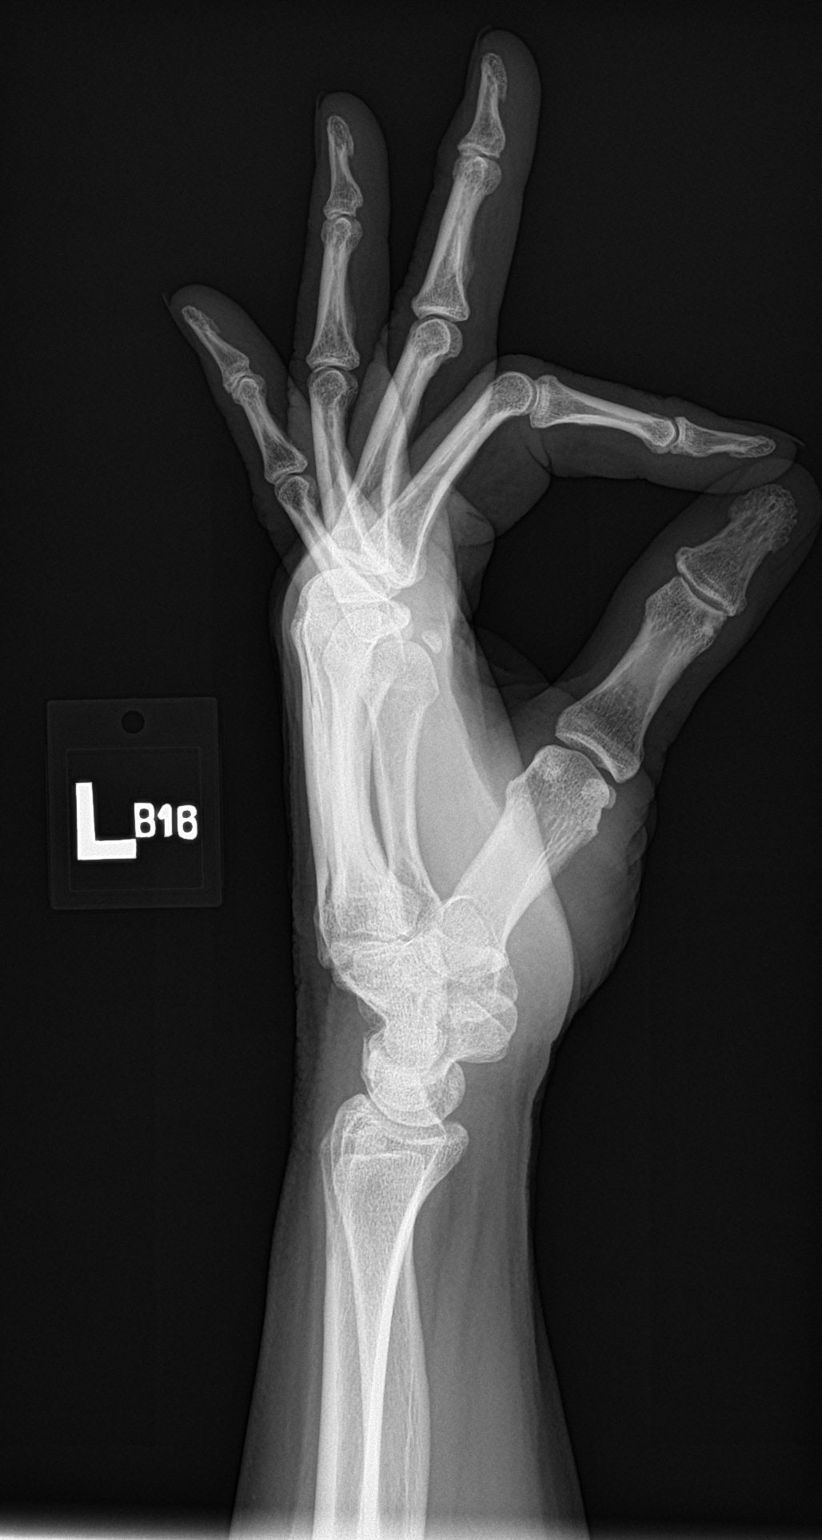

[3 of 3 positions shown; findings below may reference images not displayed]

FINDINGS: The left radiocarpal joint space appears normal and the carpal bones
are in normal position. The ulnar styloid is intact. No significant
degenerative change is seen involving the left first CMC joint. MCP
and PIP joints appear normal. Mild degenerative change of the DIP
joints is present. No erosion is seen.
IMPRESSION: Mild degenerative change of the DIP joints.  No other abnormality.

## 2019-04-19 ENCOUNTER — Other Ambulatory Visit: Payer: Self-pay

## 2019-04-19 ENCOUNTER — Ambulatory Visit (INDEPENDENT_AMBULATORY_CARE_PROVIDER_SITE_OTHER): Payer: BC Managed Care – PPO | Admitting: Physician Assistant

## 2019-04-19 ENCOUNTER — Encounter: Payer: Self-pay | Admitting: Physician Assistant

## 2019-04-19 VITALS — BP 115/76 | HR 65 | Temp 98.0°F | Resp 16 | Ht 67.0 in | Wt 165.0 lb

## 2019-04-19 DIAGNOSIS — B078 Other viral warts: Secondary | ICD-10-CM | POA: Diagnosis not present

## 2019-04-19 DIAGNOSIS — E559 Vitamin D deficiency, unspecified: Secondary | ICD-10-CM

## 2019-04-19 DIAGNOSIS — Z0000 Encounter for general adult medical examination without abnormal findings: Secondary | ICD-10-CM

## 2019-04-19 DIAGNOSIS — Z1239 Encounter for other screening for malignant neoplasm of breast: Secondary | ICD-10-CM

## 2019-04-19 DIAGNOSIS — E039 Hypothyroidism, unspecified: Secondary | ICD-10-CM | POA: Diagnosis not present

## 2019-04-19 DIAGNOSIS — B079 Viral wart, unspecified: Secondary | ICD-10-CM

## 2019-04-19 NOTE — Patient Instructions (Signed)

## 2019-04-19 NOTE — Progress Notes (Signed)
Patient: Yolanda Fox, Female    DOB: 1961/01/04, 58 y.o.   MRN: [id number] Visit Date: 04/19/2019  Today's Provider: Mar Daring, PA-C   Chief Complaint  Patient presents with  . Annual Exam   Subjective:     Annual physical exam Yolanda Fox is a 58 y.o. female who presents today for health maintenance and complete physical. She feels well. She reports exercising yes. She reports she is sleeping well. ----------------------------------------------   Review of Systems  Constitutional: Positive for fatigue (with working in the heat).  HENT: Positive for sinus pressure.   Eyes: Negative.   Respiratory: Negative.   Cardiovascular: Negative.   Gastrointestinal: Negative.        Hemorrhoids   Endocrine: Negative.   Genitourinary: Negative.   Musculoskeletal: Positive for neck stiffness.  Skin: Negative.   Allergic/Immunologic: Positive for environmental allergies.  Neurological: Negative.   Hematological: Negative.   Psychiatric/Behavioral: Negative.     Social History      She  reports that she has never smoked. She has never used smokeless tobacco. She reports current alcohol use. She reports that she does not use drugs.       Social History   Socioeconomic History  . Marital status: Married    Spouse name: Not on file  . Number of children: Not on file  . Years of education: Not on file  . Highest education level: Not on file  Occupational History  . Not on file  Social Needs  . Financial resource strain: Not on file  . Food insecurity    Worry: Not on file    Inability: Not on file  . Transportation needs    Medical: Not on file    Non-medical: Not on file  Tobacco Use  . Smoking status: Never Smoker  . Smokeless tobacco: Never Used  Substance and Sexual Activity  . Alcohol use: Yes    Alcohol/week: 0.0 standard drinks    Comment: occassionally  . Drug use: No  . Sexual activity: Not on file  Lifestyle  . Physical activity    Days  per week: Not on file    Minutes per session: Not on file  . Stress: Not on file  Relationships  . Social Herbalist on phone: Not on file    Gets together: Not on file    Attends religious service: Not on file    Active member of club or organization: Not on file    Attends meetings of clubs or organizations: Not on file    Relationship status: Not on file  Other Topics Concern  . Not on file  Social History Narrative  . Not on file    Past Medical History:  Diagnosis Date  . Acute pharyngitis   . Benign neoplasm of colon   . Bicornuate uterus   . Degeneration of intervertebral disc, site unspecified   . Esophageal reflux   . External thrombosed hemorrhoids   . Hematuria, unspecified   . Nontoxic uninodular goiter   . Osteoarthrosis, unspecified whether generalized or localized, unspecified site   . Unspecified adjustment reaction   . Unspecified hypothyroidism   . Unspecified vitamin D deficiency      Patient Active Problem List   Diagnosis Date Noted  . Vitamin D deficiency 03/06/2016  . RAD (reactive airway disease) 03/06/2016  . Arthritis, degenerative 03/06/2016  . Adult hypothyroidism 03/06/2016  . Hot flash, menopausal 03/06/2016  . Acid  reflux 03/06/2016  . Narrowing of intervertebral disc space 03/06/2016  . Benign neoplasm of colon 03/06/2016  . Cervical polyp 03/06/2016  . Bradycardia 03/06/2016  . Bicornate uterus 03/06/2016  . AB (asthmatic bronchitis) 03/06/2016  . Allergic rhinitis 03/06/2016  . Adaptation reaction 03/06/2016  . Thyroid nodule 03/01/2015  . Chest pain 01/23/2012  . Dyspnea 01/23/2012  . Sinus bradycardia 01/23/2012  . External hemorrhoid, thrombosed 06/18/2006    Past Surgical History:  Procedure Laterality Date  . THYROIDECTOMY, PARTIAL  1991  . [place] History        Family Status  Relation Name Status  . Mother  Deceased       rheumatoid arthritis, peptic ulcer, gastroesophageal  reflux disease, emphysema, hiatal hernia, gallbladder disease, colon polyps  . Father  Deceased       allergies, hypertension, arthritis, coronary artery disease, renal disease, alcoholism, colon polyps  . Sister  Alive       back surgery  . Brother  Alive       back problems  . Brother  Alive       diabetes mellitus type II, Hypertension,back problems  . Sister  Deceased       mva  . Neg Hx  (Not Specified)        Her family history includes Heart disease in her father. There is no history of Breast cancer.      Allergies  Allergen Reactions  . Codeine   . Penicillins      Current Outpatient Medications:  .  aspirin 81 MG tablet, Take by mouth 3 (three) times a week. , Disp: , Rfl:  .  famotidine (PEPCID) 20 MG tablet, Take by mouth., Disp: , Rfl:  .  fluticasone (FLONASE) 50 MCG/ACT nasal spray, Place into both nostrils at bedtime as needed for allergies or rhinitis., Disp: , Rfl:  .  Folic Acid (FOLATE PO), Take by mouth., Disp: , Rfl:  .  levothyroxine (SYNTHROID, LEVOTHROID) 100 MCG tablet, TAKE 1 TABLET BY MOUTH DAILY BEFORE BREAKFAST-GENERIC FOR SYNTHROID-, Disp: 90 tablet, Rfl: 1 .  loratadine (CLARITIN) 10 MG tablet, Take 10 mg by mouth daily., Disp: , Rfl:  .  montelukast (SINGULAIR) 10 MG tablet, Take 1 tablet (10 mg total) by mouth at bedtime., Disp: 90 tablet, Rfl: 4 .  SOOLANTRA 1 % CREA, Apply A thin coat TO THE entire FACE EVERY NIGHT AT BEDTIME., Disp: , Rfl: 3 .  cyclobenzaprine (FLEXERIL) 5 MG tablet, Take 1 tablet (5 mg total) by mouth at bedtime. (Patient not taking: Reported on 06/25/2018), Disp: 30 tablet, Rfl: 1 .  methylPREDNISolone (MEDROL) 4 MG TBPK tablet, 6 day taper; take as directed on package instructions (Patient not taking: Reported on 04/19/2019), Disp: 21 tablet, Rfl: 0 .  Multiple Vitamin (MULTIVITAMIN) capsule, Take 1 capsule by mouth daily., Disp: , Rfl:    Patient Care Team: Rubye Beach as PCP - General (Family Medicine)     Objective:    Vitals: BP 115/76 (BP Location: Right Arm, Patient Position: Sitting, Cuff Size: Large)   Pulse 65   Temp 98 F (36.7 C) (Oral)   Resp 16   Ht 5\' 7"  (1.702 m)   Wt 165 lb (74.8 kg)   SpO2 95%   BMI 25.84 kg/m    Vitals:   04/19/19 0930  BP: 115/76  Pulse: 65  Resp: 16  Temp: 98 F (36.7 C)  TempSrc: Oral  SpO2: 95%  Weight: 165 lb (74.8 kg)  Height: 5\' 7"  (1.702 m)     Physical Exam Vitals signs reviewed.  Constitutional:      General: She is not in acute distress.    Appearance: Normal appearance. She is well-developed and normal weight. She is not ill-appearing or diaphoretic.  HENT:     Head: Normocephalic and atraumatic.     Right Ear: Tympanic membrane, ear canal and external ear normal.     Left Ear: Tympanic membrane, ear canal and external ear normal.     Nose: Nose normal.     Mouth/Throat:     Mouth: Mucous membranes are moist.     Pharynx: No oropharyngeal exudate.  Eyes:     General: No scleral icterus.       Right eye: No discharge.        Left eye: No discharge.     Extraocular Movements: Extraocular movements intact.     Conjunctiva/sclera: Conjunctivae normal.     Pupils: Pupils are equal, round, and reactive to light.  Neck:     Musculoskeletal: Normal range of motion and neck supple.     Thyroid: No thyromegaly.     Vascular: No carotid bruit or JVD.     Trachea: No tracheal deviation.  Cardiovascular:     Rate and Rhythm: Normal rate and regular rhythm.     Pulses: Normal pulses.     Heart sounds: Normal heart sounds. No murmur. No friction rub. No gallop.   Pulmonary:     Effort: Pulmonary effort is normal. No respiratory distress.     Breath sounds: Normal breath sounds. No wheezing or rales.  Chest:     Chest wall: No tenderness.     Breasts:        Right: Normal.        Left: Normal.  Abdominal:     General: Abdomen is flat. Bowel sounds are normal. There is no distension.     Palpations: Abdomen is soft. There is  no mass.     Tenderness: There is no abdominal tenderness. There is no guarding or rebound.  Musculoskeletal: Normal range of motion.        General: No tenderness.     Right lower leg: No edema.     Left lower leg: No edema.  Lymphadenopathy:     Cervical: No cervical adenopathy.     Upper Body:     Right upper body: No supraclavicular, axillary or pectoral adenopathy.     Left upper body: No supraclavicular, axillary or pectoral adenopathy.  Skin:    General: Skin is warm and dry.     Capillary Refill: Capillary refill takes less than 2 seconds.     Findings: No rash.  Neurological:     General: No focal deficit present.     Mental Status: She is alert and oriented to person, place, and time. Mental status is at baseline.     Cranial Nerves: No cranial nerve deficit.     Motor: No weakness.     Coordination: Coordination normal.     Gait: Gait normal.  Psychiatric:        Mood and Affect: Mood normal.        Behavior: Behavior normal.        Thought Content: Thought content normal.        Judgment: Judgment normal.      Depression Screen PHQ 2/9 Scores 04/19/2019 03/13/2018 03/12/2017 03/01/2015  PHQ - 2 Score 2 0  0 0  PHQ- 9 Score 4 2 2  -       Assessment & Plan:     Routine Health Maintenance and Physical Exam  Exercise Activities and Dietary recommendations Goals   None     Immunization History  Administered Date(s) Administered  . Influenza-Unspecified 07/15/2017, 06/26/2018    Health Maintenance  Topic Date Due  . TETANUS/TDAP  01/29/2017  . COLONOSCOPY  07/08/2018  . INFLUENZA VACCINE  04/17/2019  . MAMMOGRAM  05/13/2020  . PAP SMEAR-Modifier  03/13/2021  . Hepatitis C Screening  Completed  . HIV Screening  Completed     Discussed health benefits of physical activity, and encouraged her to engage in regular exercise appropriate for her age and condition.    1. Annual physical exam Normal physical exam today. Will check labs as below and f/u  pending lab results. If labs are stable and WNL she will not need to have these rechecked for one year at her next annual physical exam. She is to call the office in the meantime if she has any acute issue, questions or concerns. - CBC w/Diff/Platelet - Comprehensive Metabolic Panel (CMET) - TSH - Lipid Profile - HgB A1c  2. Breast cancer screening Breast exam today was normal. There is no family history of breast cancer. She does perform regular self breast exams. Mammogram was ordered as below. Information for [facility] Breast clinic was given to patient so she may schedule her mammogram at her convenience. - MM 3D SCREEN BREAST BILATERAL; Future  3. Adult hypothyroidism Stable. Will check labs as below and f/u pending results. - TSH  4. Vitamin D deficiency H/O this and post menopausal. Will check labs as below and f/u pending results. - Vitamin D (25 hydroxy)  5. [place] Had lesion on right posterior shoulder that was being hit by her bra strap. Cryotherapy performed on the lesion. - Cryotherapy/destruct benign or premalignant lesion  --------------------------------------------------------------------    Mar Daring, PA-C  [place] .

## 2019-04-20 ENCOUNTER — Telehealth: Payer: Self-pay

## 2019-04-20 LAB — CBC WITH DIFFERENTIAL/PLATELET
Basophils Absolute: 0.1 10*3/uL (ref 0.0–0.2)
Basos: 1 %
EOS (ABSOLUTE): 0.4 10*3/uL (ref 0.0–0.4)
Eos: 5 %
Hematocrit: 38.7 % (ref 34.0–46.6)
Hemoglobin: 12.9 g/dL (ref 11.1–15.9)
Immature Grans (Abs): 0 10*3/uL (ref 0.0–0.1)
Immature Granulocytes: 0 %
Lymphocytes Absolute: 1.8 10*3/uL (ref 0.7–3.1)
Lymphs: 25 %
MCH: 28.7 pg (ref 26.6–33.0)
MCHC: 33.3 g/dL (ref 31.5–35.7)
MCV: 86 fL (ref 79–97)
Monocytes Absolute: 0.7 10*3/uL (ref 0.1–0.9)
Monocytes: 9 %
Neutrophils Absolute: 4.2 10*3/uL (ref 1.4–7.0)
Neutrophils: 60 %
Platelets: 329 10*3/uL (ref 150–450)
RBC: 4.5 x10E6/uL (ref 3.77–5.28)
RDW: 13.7 % (ref 11.7–15.4)
WBC: 7.1 10*3/uL (ref 3.4–10.8)

## 2019-04-20 LAB — COMPREHENSIVE METABOLIC PANEL
ALT: 14 IU/L (ref 0–32)
AST: 17 IU/L (ref 0–40)
Albumin/Globulin Ratio: 2.2 (ref 1.2–2.2)
Albumin: 4.4 g/dL (ref 3.8–4.9)
Alkaline Phosphatase: 69 IU/L (ref 39–117)
BUN/Creatinine Ratio: 16 (ref 9–23)
BUN: 12 mg/dL (ref 6–24)
Bilirubin Total: 0.4 mg/dL (ref 0.0–1.2)
CO2: 24 mmol/L (ref 20–29)
Calcium: 9.2 mg/dL (ref 8.7–10.2)
Chloride: 106 mmol/L (ref 96–106)
Creatinine, Ser: 0.75 mg/dL (ref 0.57–1.00)
GFR calc Af Amer: 101 mL/min/{1.73_m2} (ref >59)
GFR calc non Af Amer: 88 mL/min/{1.73_m2} (ref >59)
Globulin, Total: 2 g/dL (ref 1.5–4.5)
Glucose: 88 mg/dL (ref 65–99)
Potassium: 4.5 mmol/L (ref 3.5–5.2)
Sodium: 143 mmol/L (ref 134–144)
Total Protein: 6.4 g/dL (ref 6.0–8.5)

## 2019-04-20 LAB — TSH: TSH: 1.85 u[IU]/mL (ref 0.450–4.500)

## 2019-04-20 LAB — HEMOGLOBIN A1C
Est. average glucose Bld gHb Est-mCnc: 108 mg/dL
Hgb A1c MFr Bld: 5.4 % (ref 4.8–5.6)

## 2019-04-20 LAB — VITAMIN D 25 HYDROXY (VIT D DEFICIENCY, FRACTURES): Vit D, 25-Hydroxy: 46.9 ng/mL (ref 30.0–100.0)

## 2019-04-20 LAB — LIPID PANEL
Chol/HDL Ratio: 2.9 ratio (ref 0.0–4.4)
Cholesterol, Total: 142 mg/dL (ref 100–199)
HDL: 49 mg/dL (ref >39)
LDL Calculated: 80 mg/dL (ref 0–99)
Triglycerides: 67 mg/dL (ref 0–149)
VLDL Cholesterol Cal: 13 mg/dL (ref 5–40)

## 2019-04-20 NOTE — Telephone Encounter (Signed)
Patient notified of lab results

## 2019-04-20 NOTE — Telephone Encounter (Signed)
-----   Message from Mar Daring, [place] sent at 04/20/2019  2:00 PM EDT ----- All labs are within normal limits and stable.  Thanks! -JB

## 2019-04-21 ENCOUNTER — Encounter: Payer: Self-pay | Admitting: Physician Assistant

## 2019-07-02 ENCOUNTER — Ambulatory Visit
Admission: RE | Admit: 2019-07-02 | Discharge: 2019-07-02 | Disposition: A | Discharge status: Home / Self Care | Payer: BC Managed Care – PPO | Source: Ambulatory Visit | Attending: Physician Assistant | Admitting: Physician Assistant

## 2019-07-02 DIAGNOSIS — Z1231 Encounter for screening mammogram for malignant neoplasm of breast: Secondary | ICD-10-CM | POA: Diagnosis not present

## 2019-07-02 DIAGNOSIS — Z1239 Encounter for other screening for malignant neoplasm of breast: Secondary | ICD-10-CM

## 2019-07-05 ENCOUNTER — Telehealth: Payer: Self-pay

## 2019-07-05 NOTE — Telephone Encounter (Signed)
Patient advised as below.  

## 2019-07-05 NOTE — Telephone Encounter (Signed)
-----   Message from Mar Daring, [place] sent at 07/05/2019  2:08 PM EDT ----- Normal mammogram. Repeat screening in one year.

## 2019-07-06 ENCOUNTER — Ambulatory Visit: Payer: Self-pay

## 2019-07-06 ENCOUNTER — Other Ambulatory Visit: Payer: Self-pay

## 2019-07-06 DIAGNOSIS — Z23 Encounter for immunization: Secondary | ICD-10-CM

## 2019-07-11 ENCOUNTER — Encounter: Payer: Self-pay | Admitting: Physician Assistant

## 2019-07-11 DIAGNOSIS — J45990 Exercise induced bronchospasm: Secondary | ICD-10-CM

## 2019-07-12 MED ORDER — ALBUTEROL SULFATE HFA 108 (90 BASE) MCG/ACT IN AERS: 1.0000 | INHALATION_SPRAY | Freq: Four times a day (QID) | RESPIRATORY_TRACT | 1 refills | Status: DC | PRN

## 2019-07-14 ENCOUNTER — Ambulatory Visit: Payer: Self-pay

## 2019-07-16 ENCOUNTER — Other Ambulatory Visit: Payer: Self-pay | Admitting: Physician Assistant

## 2019-07-16 DIAGNOSIS — E039 Hypothyroidism, unspecified: Secondary | ICD-10-CM

## 2019-10-05 ENCOUNTER — Other Ambulatory Visit: Payer: Self-pay | Admitting: Family Medicine

## 2019-10-05 ENCOUNTER — Other Ambulatory Visit: Payer: Self-pay | Admitting: Physician Assistant

## 2019-10-05 DIAGNOSIS — E039 Hypothyroidism, unspecified: Secondary | ICD-10-CM

## 2019-10-05 DIAGNOSIS — J302 Other seasonal allergic rhinitis: Secondary | ICD-10-CM

## 2019-10-05 NOTE — Telephone Encounter (Signed)
Requested Prescriptions  Pending Prescriptions Disp Refills  . levothyroxine (SYNTHROID) 100 MCG tablet [Pharmacy Med Name: LEVOTHYROXINE 0.100MG (100MCG) TABS] 90 tablet 0    Sig: TAKE 1 TABLET BY MOUTH DAILY BEFORE BREAKFAST GENERIC EQUIVALENT FOR SYNTHROID     Endocrinology:  Hypothyroid Agents Failed - 10/05/2019  9:00 AM      Failed - TSH needs to be rechecked within 3 months after an abnormal result. Refill until TSH is due.      Passed - TSH in normal range and within 360 days    TSH  Date Value Ref Range Status  04/19/2019 1.850 0.450 - 4.500 uIU/mL Final         Passed - Valid encounter within last 12 months    Recent Outpatient Visits          5 months ago Annual physical exam   [facility] [place], Clearnce Sorrel, [place]   1 year ago [facility] arthritis   [place], Clearnce Sorrel, [place]   1 year ago Annual physical exam   [place], Clearnce Sorrel, [place]   2 years ago Annual physical exam   [facility] [place], Clearnce Sorrel, [place]   3 years ago Annual physical exam   [facility] [place], [place]

## 2019-10-19 ENCOUNTER — Encounter: Payer: Self-pay | Admitting: Physician Assistant

## 2019-10-19 DIAGNOSIS — J302 Other seasonal allergic rhinitis: Secondary | ICD-10-CM

## 2019-10-19 MED ORDER — MONTELUKAST SODIUM 10 MG PO TABS: 10.0000 mg | ORAL_TABLET | Freq: Every day | ORAL | 4 refills | Status: DC

## 2019-12-02 ENCOUNTER — Other Ambulatory Visit: Payer: Self-pay

## 2019-12-02 ENCOUNTER — Ambulatory Visit: Payer: BC Managed Care – PPO | Admitting: Dermatology

## 2019-12-02 DIAGNOSIS — L821 Other seborrheic keratosis: Secondary | ICD-10-CM | POA: Diagnosis not present

## 2019-12-02 DIAGNOSIS — D229 Melanocytic nevi, unspecified: Secondary | ICD-10-CM

## 2019-12-02 DIAGNOSIS — D225 Melanocytic nevi of trunk: Secondary | ICD-10-CM

## 2019-12-02 DIAGNOSIS — L820 Inflamed seborrheic keratosis: Secondary | ICD-10-CM | POA: Diagnosis not present

## 2019-12-02 DIAGNOSIS — D239 Other benign neoplasm of skin, unspecified: Secondary | ICD-10-CM

## 2019-12-02 DIAGNOSIS — D1801 Hemangioma of skin and subcutaneous tissue: Secondary | ICD-10-CM

## 2019-12-02 DIAGNOSIS — L814 Other melanin hyperpigmentation: Secondary | ICD-10-CM

## 2019-12-02 DIAGNOSIS — L719 Rosacea, unspecified: Secondary | ICD-10-CM

## 2019-12-02 DIAGNOSIS — D2372 Other benign neoplasm of skin of left lower limb, including hip: Secondary | ICD-10-CM

## 2019-12-02 MED ORDER — SOOLANTRA 1 % EX CREA: 1.0000 "application " | TOPICAL_CREAM | Freq: Every evening | CUTANEOUS | 6 refills | Status: DC

## 2019-12-02 NOTE — Progress Notes (Signed)
   Follow-Up Visit   Subjective  Yolanda Fox is a 59 y.o. female who presents for the following: Annual Exam (Itchy spots on back and face. rosacea med rf).  The following portions of the chart were reviewed this encounter and updated as appropriate:     Review of Systems: No other skin or systemic complaints.  Objective  Well appearing patient in no apparent distress; mood and affect are within normal limits.  A full examination was performed including scalp, head, eyes, ears, nose, lips, neck, chest, axillae, abdomen, back, buttocks, bilateral upper extremities, bilateral lower extremities, hands, feet, fingers, toes, fingernails, and toenails. All findings within normal limits unless otherwise noted below.  Objective  Left Medial Calf: Firm pink/brown papulenodule with dimple sign, biopsy proven DF.  Objective  Abdomen, Legs: Red papules.   Objective  Left Flank x 2, Right Flank x 3, Left groin x 1, Left sideburn x 1, Spinal upper back x 1 (8): Erythematous keratotic or waxy stuck-on papule or plaque.   Objective  Back, trunk: Scattered tan macules.   Objective  Trunk, extremities: Tan-brown and/or pink-flesh-colored symmetric macules and papules.  2.0 mm medium dark brown macule of the left lower back. 2.0 mm medium dark brown macule of the left medial thigh. 6.0 mm speckled brown macule of the left lateral breast.  Objective  malar cheeks, nose: Mid face erythema with telangiectasias.  Objective  Left Flank, Right Temple: Stuck-on, waxy, tan-brown papules and plaques -- Discussed benign etiology and prognosis.   Assessment & Plan  Dermatofibroma Left Medial Calf  Benign, observe.  Hemangioma of skin Abdomen, Legs  Benign, observe.  Inflamed seborrheic keratosis (8) Left Flank x 2, Right Flank x 3, Left groin x 1, Left sideburn x 1, Spinal upper back x 1  Destruction of lesion - Left Flank x 2, Right Flank x 3, Left groin x 1, Left sideburn x 1,  Spinal upper back x 1  Destruction method: cryotherapy   Destruction method comment:  Electrodessication Informed consent: discussed and consent obtained   Lesion destroyed using liquid nitrogen: Yes   Outcome: patient tolerated procedure well with no complications   Post-procedure details: wound care instructions given    Lentigines Back, trunk  Benign, observe.  Nevus Trunk, extremities  Benign, observe.  Rosacea malar cheeks, nose  Reordered Medications Ivermectin (SOOLANTRA) 1 % CREA  Seborrheic keratosis Left Flank, Right Temple  Benign, observe.  Return in about 1 year (around 12/01/2020).   IJamesetta Orleans, CMA, am acting as scribe for Brendolyn Patty, MD .

## 2019-12-02 NOTE — Patient Instructions (Signed)
Cryotherapy Aftercare  . Wash gently with soap and water everyday.   . Apply Vaseline and Band-Aid daily until healed.  

## 2019-12-28 ENCOUNTER — Other Ambulatory Visit: Payer: Self-pay | Admitting: Physician Assistant

## 2019-12-28 DIAGNOSIS — E039 Hypothyroidism, unspecified: Secondary | ICD-10-CM

## 2019-12-28 NOTE — Telephone Encounter (Signed)
Requested Prescriptions  Pending Prescriptions Disp Refills  . levothyroxine (SYNTHROID) 100 MCG tablet [Pharmacy Med Name: LEVOTHYROXINE 0.100MG (100MCG) TABS] 90 tablet 0    Sig: TAKE 1 TABLET BY MOUTH DAILY BEFORE BREAKFAST GENERIC EQUIVALENT FOR SYNTHROID     Endocrinology:  Hypothyroid Agents Failed - 12/28/2019  9:00 AM      Failed - TSH needs to be rechecked within 3 months after an abnormal result. Refill until TSH is due.      Passed - TSH in normal range and within 360 days    TSH  Date Value Ref Range Status  04/19/2019 1.850 0.450 - 4.500 uIU/mL Final         Passed - Valid encounter within last 12 months    Recent Outpatient Visits          8 months ago Annual physical exam   [facility] [place], Clearnce Sorrel, [place]   1 year ago [facility] arthritis   [place], Clearnce Sorrel, [place]   1 year ago Annual physical exam   [place], Clearnce Sorrel, [place]   2 years ago Annual physical exam   [facility] [place], Clearnce Sorrel, [place]   3 years ago Annual physical exam   [facility] [place], [place]

## 2020-03-18 ENCOUNTER — Other Ambulatory Visit: Payer: Self-pay | Admitting: Physician Assistant

## 2020-03-18 DIAGNOSIS — E039 Hypothyroidism, unspecified: Secondary | ICD-10-CM

## 2020-03-18 NOTE — Telephone Encounter (Signed)
Requested Prescriptions  Pending Prescriptions Disp Refills  . levothyroxine (SYNTHROID) 100 MCG tablet [Pharmacy Med Name: LEVOTHYROXINE 0.100MG (100MCG) TABS] 90 tablet 0    Sig: TAKE 1 TABLET BY MOUTH DAILY BEFORE BREAKFAST GENERIC EQUIVALENT FOR SYNTHROID     Endocrinology:  Hypothyroid Agents Failed - 03/18/2020  9:00 AM      Failed - TSH needs to be rechecked within 3 months after an abnormal result. Refill until TSH is due.      Passed - TSH in normal range and within 360 days    TSH  Date Value Ref Range Status  04/19/2019 1.850 0.450 - 4.500 uIU/mL Final         Passed - Valid encounter within last 12 months    Recent Outpatient Visits          11 months ago Annual physical exam   [facility] [place], Clearnce Sorrel, [place]   1 year ago [facility] arthritis   [place], Clearnce Sorrel, [place]   2 years ago Annual physical exam   [place], Clearnce Sorrel, [place]   3 years ago Annual physical exam   [facility] [place], Clearnce Sorrel, [place]   4 years ago Annual physical exam   [facility] [place], [place]

## 2020-03-29 ENCOUNTER — Other Ambulatory Visit: Payer: Self-pay | Admitting: Physician Assistant

## 2020-03-29 DIAGNOSIS — E039 Hypothyroidism, unspecified: Secondary | ICD-10-CM

## 2020-03-29 MED ORDER — LEVOTHYROXINE SODIUM 100 MCG PO TABS: 100.0000 ug | ORAL_TABLET | Freq: Every day | ORAL | 0 refills | Status: DC

## 2020-03-29 NOTE — Telephone Encounter (Signed)
[facility] faxed refill request for the following medications:  levothyroxine (SYNTHROID) 100 MCG tablet  Last Rx: 03/18/2020 [facility] LOV: 04/19/2019 Please advise. Thanks TNP

## 2020-04-27 ENCOUNTER — Ambulatory Visit (INDEPENDENT_AMBULATORY_CARE_PROVIDER_SITE_OTHER): Payer: BC Managed Care – PPO | Admitting: Physician Assistant

## 2020-04-27 ENCOUNTER — Encounter: Payer: Self-pay | Admitting: Physician Assistant

## 2020-04-27 ENCOUNTER — Other Ambulatory Visit: Payer: Self-pay

## 2020-04-27 VITALS — BP 116/70 | HR 69 | Temp 98.4°F | Ht 67.0 in | Wt 160.2 lb

## 2020-04-27 DIAGNOSIS — Z23 Encounter for immunization: Secondary | ICD-10-CM | POA: Diagnosis not present

## 2020-04-27 DIAGNOSIS — E039 Hypothyroidism, unspecified: Secondary | ICD-10-CM

## 2020-04-27 DIAGNOSIS — Z0000 Encounter for general adult medical examination without abnormal findings: Secondary | ICD-10-CM | POA: Diagnosis not present

## 2020-04-27 DIAGNOSIS — S81832A Puncture wound without foreign body, left lower leg, initial encounter: Secondary | ICD-10-CM

## 2020-04-27 DIAGNOSIS — Z1239 Encounter for other screening for malignant neoplasm of breast: Secondary | ICD-10-CM | POA: Diagnosis not present

## 2020-04-27 DIAGNOSIS — Z6825 Body mass index (BMI) 25.0-25.9, adult: Secondary | ICD-10-CM

## 2020-04-27 DIAGNOSIS — E041 Nontoxic single thyroid nodule: Secondary | ICD-10-CM

## 2020-04-27 DIAGNOSIS — E559 Vitamin D deficiency, unspecified: Secondary | ICD-10-CM

## 2020-04-27 NOTE — Progress Notes (Signed)
Complete physical exam   Patient: Yolanda Fox   DOB: March 24, 1961   59 y.o. Female  MRN: [id number] Visit Date: 04/27/2020  Today's healthcare provider: Mar Daring, PA-C   Chief Complaint  Patient presents with  . Annual Exam   Mertie Moores as a scribe for [employer], PA-C.,have documented all relevant documentation on the behalf of Mar Daring, PA-C,as directed by  Mar Daring, PA-C while in the presence of Mar Daring, [place].  Subjective    Yolanda Fox is a 59 y.o. female who presents today for a complete physical exam.  She reports consuming a general diet. Home exercise routine includes walking,water arobics and yard work. She generally feels well. She reports sleeping well. She does not have additional problems to discuss today.  HPI    Past Medical History:  Diagnosis Date  . Acute pharyngitis   . Benign neoplasm of colon   . Bicornuate uterus   . Degeneration of intervertebral disc, site unspecified   . Esophageal reflux   . External thrombosed hemorrhoids   . Hematuria, unspecified   . Nontoxic uninodular goiter   . Osteoarthrosis, unspecified whether generalized or localized, unspecified site   . Unspecified adjustment reaction   . Unspecified hypothyroidism   . Unspecified vitamin D deficiency    Past Surgical History:  Procedure Laterality Date  . THYROIDECTOMY, PARTIAL  1991  . TONSILLECTOMY  1985   Social History   Socioeconomic History  . Marital status: Married    Spouse name: Not on file  . Number of children: Not on file  . Years of education: Not on file  . Highest education level: Not on file  Occupational History  . Not on file  Tobacco Use  . Smoking status: Never Smoker  . Smokeless tobacco: Never Used  Vaping Use  . Vaping Use: Never used  Substance and Sexual Activity  . Alcohol use: Yes    Alcohol/week: 0.0 standard drinks    Comment: occassionally  . Drug use: No  .  Sexual activity: Not on file  Other Topics Concern  . Not on file  Social History Narrative  . Not on file   Social Determinants of Health   Financial Resource Strain:   . Difficulty of Paying Living Expenses:   Food Insecurity:   . Worried About Charity fundraiser in the Last Year:   . Arboriculturist in the Last Year:   Transportation Needs:   . Film/video editor (Medical):   Marland Kitchen Lack of Transportation (Non-Medical):   Physical Activity:   . Days of Exercise per Week:   . Minutes of Exercise per Session:   Stress:   . Feeling of Stress :   Social Connections:   . Frequency of Communication with Friends and Family:   . Frequency of Social Gatherings with Friends and Family:   . Attends Religious Services:   . Active Member of Clubs or Organizations:   . Attends Archivist Meetings:   Marland Kitchen Marital Status:   Intimate Partner Violence:   . Fear of Current or Ex-Partner:   . Emotionally Abused:   Marland Kitchen Physically Abused:   . Sexually Abused:    Family Status  Relation Name Status  . Mother  Deceased       rheumatoid arthritis, peptic ulcer, gastroesophageal reflux disease, emphysema, hiatal hernia, gallbladder disease, colon polyps  . Father  Deceased  allergies, hypertension, arthritis, coronary artery disease, renal disease, alcoholism, colon polyps  . Sister  Alive       back surgery  . Brother  Alive       back problems  . Brother  Alive       diabetes mellitus type II, Hypertension,back problems  . Sister  Deceased       mva  . Neg Hx  (Not Specified)   Family History  Problem Relation Age of Onset  . Heart disease Father   . Breast cancer Neg Hx    Allergies  Allergen Reactions  . Codeine   . Penicillins     Patient Care Team: Mar Daring, PA-C as PCP - General (Family Medicine)   Medications: Outpatient Medications Prior to Visit  Medication Sig  . albuterol (VENTOLIN HFA) 108 (90 Base) MCG/ACT inhaler Inhale 1-2 puffs into  the lungs every 6 (six) hours as needed for wheezing or shortness of breath.  Marland Kitchen aspirin 81 MG tablet Take by mouth 3 (three) times a week.   . fluticasone (FLONASE) 50 MCG/ACT nasal spray Place into both nostrils at bedtime as needed for allergies or rhinitis.  . Folic Acid (FOLATE PO) Take by mouth.  . Ivermectin (SOOLANTRA) 1 % CREA Apply 1 application topically at bedtime.  Marland Kitchen levothyroxine (SYNTHROID) 100 MCG tablet Take 1 tablet (100 mcg total) by mouth daily before breakfast.  . loratadine (CLARITIN) 10 MG tablet Take 10 mg by mouth daily.  . montelukast (SINGULAIR) 10 MG tablet Take 1 tablet (10 mg total) by mouth at bedtime.  . Multiple Vitamin (MULTIVITAMIN) capsule Take 1 capsule by mouth daily.  . famotidine (PEPCID) 20 MG tablet Take by mouth.   No facility-administered medications prior to visit.    Review of Systems  Constitutional: Negative.   HENT: Negative.   Eyes: Negative.   Respiratory: Negative.   Cardiovascular: Negative.   Gastrointestinal: Negative.   Endocrine: Negative.   Genitourinary: Negative.   Musculoskeletal: Negative.   Skin: Negative.   Allergic/Immunologic: Negative.   Neurological: Negative.   Hematological: Negative.   Psychiatric/Behavioral: Negative.     Last CBC Lab Results  Component Value Date   WBC 7.1 04/19/2019   HGB 12.9 04/19/2019   HCT 38.7 04/19/2019   MCV 86 04/19/2019   MCH 28.7 04/19/2019   RDW 13.7 04/19/2019   PLT 329 62/94/7654   Last metabolic panel Lab Results  Component Value Date   GLUCOSE 88 04/19/2019   NA 143 04/19/2019   K 4.5 04/19/2019   CL 106 04/19/2019   CO2 24 04/19/2019   BUN 12 04/19/2019   CREATININE 0.75 04/19/2019   GFRNONAA 88 04/19/2019   GFRAA 101 04/19/2019   CALCIUM 9.2 04/19/2019   PROT 6.4 04/19/2019   ALBUMIN 4.4 04/19/2019   LABGLOB 2.0 04/19/2019   AGRATIO 2.2 04/19/2019   BILITOT 0.4 04/19/2019   ALKPHOS 69 04/19/2019   AST 17 04/19/2019   ALT 14 04/19/2019   Last  lipids Lab Results  Component Value Date   CHOL 142 04/19/2019   HDL 49 04/19/2019   LDLCALC 80 04/19/2019   TRIG 67 04/19/2019   CHOLHDL 2.9 04/19/2019      Objective    BP 116/70 (BP Location: Left Arm, Patient Position: Sitting, Cuff Size: Normal)   Pulse 69   Temp 98.4 F (36.9 C) (Oral)   Ht 5\' 7"  (1.702 m)   Wt 160 lb 3.2 oz (72.7 kg)   BMI 25.09 kg/m  BP Readings from Last 3 Encounters:  04/27/20 116/70  04/19/19 115/76  06/25/18 100/60   Wt Readings from Last 3 Encounters:  04/27/20 160 lb 3.2 oz (72.7 kg)  04/19/19 165 lb (74.8 kg)  06/25/18 159 lb 6.4 oz (72.3 kg)      Physical Exam  BP 116/70 (BP Location: Left Arm, Patient Position: Sitting, Cuff Size: Normal)   Pulse 69   Temp 98.4 F (36.9 C) (Oral)   Ht 5\' 7"  (1.702 m)   Wt 160 lb 3.2 oz (72.7 kg)   BMI 25.09 kg/m   General Appearance:     Overweight female. Alert, cooperative, in no acute distress, appears stated age   Head:    Normocephalic, without obvious abnormality, atraumatic  Eyes:    PERRL, conjunctiva/corneas clear, EOM's intact, fundi    benign, both eyes  Ears:    Normal TM's and external ear canals, both ears  Nose:   Nares normal, septum midline, mucosa normal, no drainage    or sinus tenderness  Throat:   Lips, mucosa, and tongue normal; teeth and gums normal  Neck:   Supple, symmetrical, trachea midline, no adenopathy;    thyroid:  no enlargement/tenderness/nodules; no carotid   bruit or JVD  Back:     Symmetric, no curvature, ROM normal, no CVA tenderness  Lungs:     Clear to auscultation bilaterally, respirations unlabored  Chest Wall:    No tenderness or deformity   Heart:    Normal heart rate. Normal rhythm. No murmurs, rubs, or gallops.   Breast Exam:    deferred  Abdomen:     Soft, non-tender, bowel sounds active all four quadrants,    no masses, no organomegaly  Pelvic:    deferred  Extremities:   All extremities are intact. No cyanosis or edema  Pulses:   2+ and  symmetric all extremities  Skin:   Skin color, texture, turgor normal, no rashes or lesions  Lymph nodes:   Cervical, supraclavicular, and axillary nodes normal  Neurologic:   CNII-XII intact, normal strength, sensation and reflexes    throughout     Last depression screening scores PHQ 2/9 Scores 04/27/2020 04/19/2019 03/13/2018  PHQ - 2 Score 0 2 0  PHQ- 9 Score - 4 2   Last fall risk screening Fall Risk  04/27/2020  Falls in the past year? 1  Number falls in past yr: 0  Injury with Fall? 1   Last Audit-C alcohol use screening Alcohol Use Disorder Test (AUDIT) 04/19/2019  1. How often do you have a drink containing alcohol? 2  2. How many drinks containing alcohol do you have on a typical day when you are drinking? 0  3. How often do you have six or more drinks on one occasion? 0  AUDIT-C Score 2   A score of 3 or more in women, and 4 or more in men indicates increased risk for alcohol abuse, EXCEPT if all of the points are from question 1   No results found for any visits on 04/27/20.  Assessment & Plan    Routine Health Maintenance and Physical Exam  Exercise Activities and Dietary recommendations Goals   None     Immunization History  Administered Date(s) Administered  . Influenza,inj,Quad PF,6+ Mos 07/06/2019  . Influenza-Unspecified 07/15/2017, 06/26/2018  . Tdap 01/30/2007    Health Maintenance  Topic Date Due  . TETANUS/TDAP  01/29/2017  . INFLUENZA VACCINE  04/16/2020  . PAP SMEAR-Modifier  03/13/2021  .  MAMMOGRAM  07/01/2021  . COLONOSCOPY  06/03/2023  . Hepatitis C Screening  Completed  . HIV Screening  Completed    Discussed health benefits of physical activity, and encouraged her to engage in regular exercise appropriate for her age and condition.  1. Annual physical exam Normal physical exam today. Will check labs as below and f/u pending lab results. If labs are stable and WNL she will not need to have these rechecked for one year at her next  annual physical exam. She is to call the office in the meantime if she has any acute issue, questions or concerns. - CBC w/Diff/Platelet - Comprehensive Metabolic Panel (CMET) - TSH - Lipid Panel With LDL/HDL Ratio - HgB A1c - Vitamin D (25 hydroxy)  2. Encounter for breast cancer screening using non-mammogram modality Due in October. Educated with self breast exams. Mammogram ordered.   3. Adult hypothyroidism Stable on levothyroxine 151mcg. Will check labs as below and f/u pending results. - CBC w/Diff/Platelet - TSH  4. Thyroid nodule S/P partial thyroidectomy. - CBC w/Diff/Platelet  5. Vitamin D deficiency H/O this and postmenopausal. Will check labs as below and f/u pending results. - CBC w/Diff/Platelet - Vitamin D (25 hydroxy)  6. BMI 25.0-25.9,adult Counseled patient on healthy lifestyle modifications including dieting and exercise.  Will check labs as below and f/u pending results. - CBC w/Diff/Platelet - Comprehensive Metabolic Panel (CMET) - Lipid Panel With LDL/HDL Ratio - HgB A1c  7. Need for Tdap vaccination Tdap Vaccine given to patient without complications. Patient sat for 15 minutes after administration and was tolerated well without adverse effects. - Tdap vaccine greater than or equal to 7yo IM  8. Puncture wound of left lower extremity, initial encounter See above medical treatment plan. - Tdap vaccine greater than or equal to 7yo IM   No follow-ups on file.     Reynolds Bowl, PA-C, have reviewed all documentation for this visit. The documentation on 04/27/20 for the exam, diagnosis, procedures, and orders are all accurate and complete.   Rubye Beach  [facility] [phone] (phone) [phone] (fax)  [place]

## 2020-04-27 NOTE — Patient Instructions (Signed)
[facility] [place],  [place]  [address] [address]: [phone]  Health Maintenance for Postmenopausal Women Menopause is a normal process in which your ability to get pregnant comes to an end. This process happens slowly over many months or years, usually between the ages of 68 and 38. Menopause is complete when you have missed your menstrual periods for 12 months. It is important to talk with your health care provider about some of the most common conditions that affect women after menopause (postmenopausal women). These include heart disease, cancer, and bone loss (osteoporosis). Adopting a healthy lifestyle and getting preventive care can help to promote your health and wellness. The actions you take can also lower your chances of developing some of these common conditions. What should I know about menopause? During menopause, you may get a number of symptoms, such as:  Hot flashes. These can be moderate or severe.  Night sweats.  Decrease in sex drive.  Mood swings.  Headaches.  Tiredness.  Irritability.  Memory problems.  Insomnia. Choosing to treat or not to treat these symptoms is a decision that you make with your health care provider. Do I need hormone replacement therapy?  Hormone replacement therapy is effective in treating symptoms that are caused by menopause, such as hot flashes and night sweats.  Hormone replacement carries certain risks, especially as you become older. If you are thinking about using estrogen or estrogen with progestin, discuss the benefits and risks with your health care provider. What is my risk for heart disease and stroke? The risk of heart disease, heart attack, and stroke increases as you age. One of the causes may be a change in the body's hormones during menopause. This can affect how your body uses dietary fats, triglycerides, and cholesterol. Heart attack and stroke are medical emergencies.  There are many things that you can do to help prevent heart disease and stroke. Watch your blood pressure  High blood pressure causes heart disease and increases the risk of stroke. This is more likely to develop in people who have high blood pressure readings, are of African descent, or are overweight.  Have your blood pressure checked: ? Every 3-5 years if you are 34-67 years of age. ? Every year if you are 37 years old or older. Eat a healthy diet   Eat a diet that includes plenty of vegetables, fruits, low-fat dairy products, and lean protein.  Do not eat a lot of foods that are high in solid fats, added sugars, or sodium. Get regular exercise Get regular exercise. This is one of the most important things you can do for your health. Most adults should:  Try to exercise for at least 150 minutes each week. The exercise should increase your heart rate and make you sweat (moderate-intensity exercise).  Try to do strengthening exercises at least twice each week. Do these in addition to the moderate-intensity exercise.  Spend less time sitting. Even light physical activity can be beneficial. Other tips  Work with your health care provider to achieve or maintain a healthy weight.  Do not use any products that contain nicotine or tobacco, such as cigarettes, e-cigarettes, and chewing tobacco. If you need help quitting, ask your health care provider.  Know your numbers. Ask your health care provider to check your cholesterol and your blood sugar (glucose). Continue to have your blood tested as directed by your health care provider. Do I need screening for cancer? Depending on your  health history and family history, you may need to have cancer screening at different stages of your life. This may include screening for:  Breast cancer.  Cervical cancer.  Lung cancer.  Colorectal cancer. What is my risk for osteoporosis? After menopause, you may be at increased risk for osteoporosis.  Osteoporosis is a condition in which bone destruction happens more quickly than new bone creation. To help prevent osteoporosis or the bone fractures that can happen because of osteoporosis, you may take the following actions:  If you are 1-53 years old, get at least 1,000 mg of calcium and at least 600 mg of vitamin D per day.  If you are older than age 18 but younger than age 54, get at least 1,200 mg of calcium and at least 600 mg of vitamin D per day.  If you are older than age 60, get at least 1,200 mg of calcium and at least 800 mg of vitamin D per day. Smoking and drinking excessive alcohol increase the risk of osteoporosis. Eat foods that are rich in calcium and vitamin D, and do weight-bearing exercises several times each week as directed by your health care provider. How does menopause affect my mental health? Depression may occur at any age, but it is more common as you become older. Common symptoms of depression include:  Low or sad mood.  Changes in sleep patterns.  Changes in appetite or eating patterns.  Feeling an overall lack of motivation or enjoyment of activities that you previously enjoyed.  Frequent crying spells. Talk with your health care provider if you think that you are experiencing depression. General instructions See your health care provider for regular wellness exams and vaccines. This may include:  Scheduling regular health, dental, and eye exams.  Getting and maintaining your vaccines. These include: ? Influenza vaccine. Get this vaccine each year before the flu season begins. ? Pneumonia vaccine. ? Shingles vaccine. ? Tetanus, diphtheria, and pertussis (Tdap) booster vaccine. Your health care provider may also recommend other immunizations. Tell your health care provider if you have ever been abused or do not feel safe at home. Summary  Menopause is a normal process in which your ability to get pregnant comes to an end.  This condition causes  hot flashes, night sweats, decreased interest in sex, mood swings, headaches, or lack of sleep.  Treatment for this condition may include hormone replacement therapy.  Take actions to keep yourself healthy, including exercising regularly, eating a healthy diet, watching your weight, and checking your blood pressure and blood sugar levels.  Get screened for cancer and depression. Make sure that you are up to date with all your vaccines. This information is not intended to replace advice given to you by your health care provider. Make sure you discuss any questions you have with your health care provider. Document Revised: 08/26/2018 Document Reviewed: 08/26/2018 Elsevier Patient Education  2020 [employer].

## 2020-05-01 DIAGNOSIS — E039 Hypothyroidism, unspecified: Secondary | ICD-10-CM | POA: Diagnosis not present

## 2020-05-01 DIAGNOSIS — E041 Nontoxic single thyroid nodule: Secondary | ICD-10-CM | POA: Diagnosis not present

## 2020-05-01 DIAGNOSIS — Z0000 Encounter for general adult medical examination without abnormal findings: Secondary | ICD-10-CM | POA: Diagnosis not present

## 2020-05-01 DIAGNOSIS — E559 Vitamin D deficiency, unspecified: Secondary | ICD-10-CM | POA: Diagnosis not present

## 2020-05-02 LAB — COMPREHENSIVE METABOLIC PANEL
ALT: 17 IU/L (ref 0–32)
AST: 18 IU/L (ref 0–40)
Albumin/Globulin Ratio: 1.8 (ref 1.2–2.2)
Albumin: 4.2 g/dL (ref 3.8–4.9)
Alkaline Phosphatase: 80 IU/L (ref 48–121)
BUN/Creatinine Ratio: 19 (ref 12–28)
BUN: 14 mg/dL (ref 8–27)
Bilirubin Total: 0.4 mg/dL (ref 0.0–1.2)
CO2: 22 mmol/L (ref 20–29)
Calcium: 9 mg/dL (ref 8.7–10.3)
Chloride: 107 mmol/L — ABNORMAL HIGH (ref 96–106)
Creatinine, Ser: 0.73 mg/dL (ref 0.57–1.00)
GFR calc Af Amer: 104 mL/min/{1.73_m2} (ref >59)
GFR calc non Af Amer: 90 mL/min/{1.73_m2} (ref >59)
Globulin, Total: 2.3 g/dL (ref 1.5–4.5)
Glucose: 89 mg/dL (ref 65–99)
Potassium: 4.5 mmol/L (ref 3.5–5.2)
Sodium: 144 mmol/L (ref 134–144)
Total Protein: 6.5 g/dL (ref 6.0–8.5)

## 2020-05-02 LAB — CBC WITH DIFFERENTIAL/PLATELET
Basophils Absolute: 0.1 10*3/uL (ref 0.0–0.2)
Basos: 1 %
EOS (ABSOLUTE): 0.3 10*3/uL (ref 0.0–0.4)
Eos: 5 %
Hematocrit: 41.3 % (ref 34.0–46.6)
Hemoglobin: 13.6 g/dL (ref 11.1–15.9)
Immature Grans (Abs): 0 10*3/uL (ref 0.0–0.1)
Immature Granulocytes: 0 %
Lymphocytes Absolute: 1.9 10*3/uL (ref 0.7–3.1)
Lymphs: 29 %
MCH: 29.3 pg (ref 26.6–33.0)
MCHC: 32.9 g/dL (ref 31.5–35.7)
MCV: 89 fL (ref 79–97)
Monocytes Absolute: 0.7 10*3/uL (ref 0.1–0.9)
Monocytes: 11 %
Neutrophils Absolute: 3.5 10*3/uL (ref 1.4–7.0)
Neutrophils: 54 %
Platelets: 292 10*3/uL (ref 150–450)
RBC: 4.64 x10E6/uL (ref 3.77–5.28)
RDW: 13.1 % (ref 11.7–15.4)
WBC: 6.5 10*3/uL (ref 3.4–10.8)

## 2020-05-02 LAB — LIPID PANEL WITH LDL/HDL RATIO
Cholesterol, Total: 154 mg/dL (ref 100–199)
HDL: 48 mg/dL (ref >39)
LDL Chol Calc (NIH): 89 mg/dL (ref 0–99)
LDL/HDL Ratio: 1.9 ratio (ref 0.0–3.2)
Triglycerides: 89 mg/dL (ref 0–149)
VLDL Cholesterol Cal: 17 mg/dL (ref 5–40)

## 2020-05-02 LAB — TSH: TSH: 2.47 u[IU]/mL (ref 0.450–4.500)

## 2020-05-02 LAB — HEMOGLOBIN A1C
Est. average glucose Bld gHb Est-mCnc: 114 mg/dL
Hgb A1c MFr Bld: 5.6 % (ref 4.8–5.6)

## 2020-05-02 LAB — VITAMIN D 25 HYDROXY (VIT D DEFICIENCY, FRACTURES): Vit D, 25-Hydroxy: 43.7 ng/mL (ref 30.0–100.0)

## 2020-05-09 ENCOUNTER — Encounter: Payer: Self-pay | Admitting: Physician Assistant

## 2020-06-09 ENCOUNTER — Other Ambulatory Visit: Payer: Self-pay | Admitting: Physician Assistant

## 2020-06-09 DIAGNOSIS — E039 Hypothyroidism, unspecified: Secondary | ICD-10-CM

## 2020-07-03 ENCOUNTER — Ambulatory Visit (INDEPENDENT_AMBULATORY_CARE_PROVIDER_SITE_OTHER): Payer: BC Managed Care – PPO | Admitting: Physician Assistant

## 2020-07-03 ENCOUNTER — Other Ambulatory Visit: Payer: Self-pay

## 2020-07-03 ENCOUNTER — Encounter: Payer: Self-pay | Admitting: Physician Assistant

## 2020-07-03 VITALS — BP 112/58 | HR 62 | Temp 98.2°F | Resp 16 | Ht 66.5 in | Wt 162.6 lb

## 2020-07-03 DIAGNOSIS — R0600 Dyspnea, unspecified: Secondary | ICD-10-CM | POA: Diagnosis not present

## 2020-07-03 DIAGNOSIS — R0609 Other forms of dyspnea: Secondary | ICD-10-CM

## 2020-07-03 DIAGNOSIS — R490 Dysphonia: Secondary | ICD-10-CM | POA: Diagnosis not present

## 2020-07-03 NOTE — Progress Notes (Signed)
Established patient visit   Patient: Yolanda Fox   DOB: [birth year]   59 y.o. Female  MRN: [id number] Visit Date: 07/03/2020  Today's healthcare provider: Mar Daring, PA-C   Chief Complaint  Patient presents with  . Hoarse   Subjective    HPI  Patient reports that she gets short of breath with talking or walking. No recent URI, fever. She has had this but now is more with her exercises like going up the stairs. Reports that she is mainly here because of her voice. Hoarse voice going in and out. She does have exercise-induced asthma. She does report increased use of her inhaler recently due to hiking more.  Patient Active Problem List   Diagnosis Date Noted  . Vitamin D deficiency 03/06/2016  . RAD (reactive airway disease) 03/06/2016  . Arthritis, degenerative 03/06/2016  . Adult hypothyroidism 03/06/2016  . Hot flash, menopausal 03/06/2016  . Acid reflux 03/06/2016  . Narrowing of intervertebral disc space 03/06/2016  . Benign neoplasm of colon 03/06/2016  . Cervical polyp 03/06/2016  . Bradycardia 03/06/2016  . Bicornate uterus 03/06/2016  . AB (asthmatic bronchitis) 03/06/2016  . Allergic rhinitis 03/06/2016  . Adaptation reaction 03/06/2016  . Thyroid nodule 03/01/2015  . Chest pain 01/23/2012  . Dyspnea 01/23/2012  . Sinus bradycardia 01/23/2012  . External hemorrhoid, thrombosed 06/18/2006   Past Medical History:  Diagnosis Date  . Acute pharyngitis   . Benign neoplasm of colon   . Bicornuate uterus   . Degeneration of intervertebral disc, site unspecified   . Esophageal reflux   . External thrombosed hemorrhoids   . Hematuria, unspecified   . Nontoxic uninodular goiter   . Osteoarthrosis, unspecified whether generalized or localized, unspecified site   . Unspecified adjustment reaction   . Unspecified hypothyroidism   . Unspecified vitamin D deficiency        Medications: Outpatient Medications Prior to Visit  Medication Sig  .  albuterol (VENTOLIN HFA) 108 (90 Base) MCG/ACT inhaler Inhale 1-2 puffs into the lungs every 6 (six) hours as needed for wheezing or shortness of breath.  Marland Kitchen aspirin 81 MG tablet Take by mouth 3 (three) times a week.   . cholecalciferol (VITAMIN D3) 25 MCG (1000 UNIT) tablet   . famotidine (PEPCID) 20 MG tablet Take by mouth.  . fluticasone (FLONASE) 50 MCG/ACT nasal spray Place into both nostrils at bedtime as needed for allergies or rhinitis.  . Ivermectin (SOOLANTRA) 1 % CREA Apply 1 application topically at bedtime.  Marland Kitchen levothyroxine (SYNTHROID) 100 MCG tablet TAKE 1 TABLET DAILY BEFORE BREAKFAST  . loratadine (CLARITIN) 10 MG tablet Take 10 mg by mouth daily.  Marnee Spring Omega-3 Krill Oil 350 MG CAPS   . montelukast (SINGULAIR) 10 MG tablet Take 1 tablet (10 mg total) by mouth at bedtime.  . Multiple Vitamin (MULTIVITAMIN) capsule Take 1 capsule by mouth daily.   No facility-administered medications prior to visit.    Review of Systems  Constitutional: Negative.   HENT: Positive for voice change. Negative for congestion, ear discharge, ear pain, postnasal drip, rhinorrhea, sinus pressure, sinus pain, sneezing, sore throat and trouble swallowing.   Respiratory: Positive for shortness of breath. Negative for cough, chest tightness and wheezing.   Cardiovascular: Negative.  Negative for chest pain, palpitations and leg swelling.  Neurological: Negative for dizziness and headaches.    Last CBC Lab Results  Component Value Date   WBC 6.5 05/01/2020   HGB 13.6 05/01/2020   HCT  41.3 05/01/2020   MCV 89 05/01/2020   MCH 29.3 05/01/2020   RDW 13.1 05/01/2020   PLT 292 35/00/9381   Last metabolic panel Lab Results  Component Value Date   GLUCOSE 89 05/01/2020   NA 144 05/01/2020   K 4.5 05/01/2020   CL 107 (H) 05/01/2020   CO2 22 05/01/2020   BUN 14 05/01/2020   CREATININE 0.73 05/01/2020   GFRNONAA 90 05/01/2020   GFRAA 104 05/01/2020   CALCIUM 9.0 05/01/2020   PROT 6.5  05/01/2020   ALBUMIN 4.2 05/01/2020   LABGLOB 2.3 05/01/2020   AGRATIO 1.8 05/01/2020   BILITOT 0.4 05/01/2020   ALKPHOS 80 05/01/2020   AST 18 05/01/2020   ALT 17 05/01/2020      Objective    BP (!) 112/58 (BP Location: Left Arm, Patient Position: Sitting, Cuff Size: Large)   Pulse 62   Temp 98.2 F (36.8 C) (Oral)   Resp 16   Ht 5' 6.5" (1.689 m)   Wt 162 lb 9.6 oz (73.8 kg)   SpO2 97%   BMI 25.85 kg/m  BP Readings from Last 3 Encounters:  07/03/20 (!) 112/58  04/27/20 116/70  04/19/19 115/76   Wt Readings from Last 3 Encounters:  07/03/20 162 lb 9.6 oz (73.8 kg)  04/27/20 160 lb 3.2 oz (72.7 kg)  04/19/19 165 lb (74.8 kg)      Physical Exam Vitals reviewed.  Constitutional:      General: She is not in acute distress.    Appearance: Normal appearance. She is well-developed, well-groomed and overweight. She is not ill-appearing or diaphoretic.  HENT:     Head: Normocephalic and atraumatic.     Right Ear: Hearing, tympanic membrane, ear canal and external ear normal.     Left Ear: Hearing, tympanic membrane, ear canal and external ear normal.     Nose: Nose normal.     Mouth/Throat:     Mouth: Mucous membranes are moist.     Pharynx: Oropharynx is clear. Uvula midline. No oropharyngeal exudate or posterior oropharyngeal erythema.  Eyes:     General: No scleral icterus.       Right eye: No discharge.        Left eye: No discharge.     Extraocular Movements: Extraocular movements intact.     Conjunctiva/sclera: Conjunctivae normal.     Pupils: Pupils are equal, round, and reactive to light.  Neck:     Thyroid: No thyromegaly.     Trachea: No tracheal deviation.  Cardiovascular:     Rate and Rhythm: Normal rate and regular rhythm.     Heart sounds: Normal heart sounds. No murmur heard.  No friction rub. No gallop.   Pulmonary:     Effort: Pulmonary effort is normal. No respiratory distress.     Breath sounds: Normal breath sounds. No stridor. No wheezing or  rales.  Musculoskeletal:     Cervical back: Normal range of motion and neck supple. No tenderness.  Lymphadenopathy:     Cervical: No cervical adenopathy.  Skin:    General: Skin is warm and dry.  Neurological:     Mental Status: She is alert.  Psychiatric:        Behavior: Behavior is cooperative.      No results found for any visits on 07/03/20.  Assessment & Plan     1. DOE (dyspnea on exertion) EKG does show sinus bradycardia with low voltage in precordial leads rate of 56. Essentially unchanged from EKG  from 2013. Will f/u after ENT referral.  - EKG 12-Lead  2. Hoarseness of voice Discussed could be multiple factors including laryngeal reflux/GERD, infection/inflammation, mass. Will refer to ENT for further evaluation. Consult appreciated.  - Ambulatory referral to ENT   No follow-ups on file.                                                               Reynolds Bowl, PA-C, have reviewed all documentation for this visit. The documentation on 07/03/20 for the exam, diagnosis, procedures, and orders are all accurate and complete.   Rubye Beach  [facility] [phone] (phone) [phone] (fax)  [place]

## 2020-07-03 NOTE — Patient Instructions (Signed)
Hoarseness  Hoarseness, also called dysphonia, is any abnormal change in your voice that can make it difficult to speak. Your voice may sound raspy, breathy, or strained. Hoarseness is caused by a problem with your vocal cords (vocal folds). These are two bands of tissue inside your voice box (larynx). When you speak, your vocal cords move back and forth to create sound. The surfaces of your vocal cords need to be smooth for your voice to sound clear. Swelling or lumps on your vocal cords can cause hoarseness. Common causes of vocal cord problems include:  Infection in the nose, throat, and upper air passages (upper respiratory infection).  A long-term cough.  Straining or overusing your voice.  Smoking, or exposure to secondhand smoke.  Allergies.  Medication side effects.  Vocal cord growths.  Vocal cord injuries.  Stomach acids that move up in your throat and irritate your vocal cords (gastroesophageal reflux).  Diseases that affect the nervous system, such as a stroke or Parkinson's disease. Follow these instructions at home: Watch your condition for any changes. To ease discomfort and protect your vocal cords:  Rest your voice.  Do not whisper. Whispering can cause muscle strain.  Do not speak in a loud or harsh voice.  Avoid coughing or clearing your throat.  Do not use any products that contain nicotine or tobacco, such as cigarettes and e-cigarettes. If you need help quitting, ask your health care provider.  Avoid secondhand smoke.  Do not eat foods that give you heartburn, such as spicy or acidic foods like hot peppers and orange juice. Heartburn can make gastroesophageal reflux worse.  Do not drink beverages that contain caffeine (coffee, tea, or soft drinks) or alcohol (beer, wine, or liquor).  Drink enough fluid to keep your urine pale yellow.  Use a humidifier if the air in your home is dry. If recommended by your health care provider, schedule an  appointment with a speech-language specialist. This specialist may give you methods to try that can help you avoid misusing your voice. Contact a health care provider if:  You have hoarseness that lasts longer than 3 weeks.  You almost lose or completely lose your voice for more than 3 days.  You have pain when you swallow or try to talk.  You feel a lump in your neck. Get help right away if:  You have trouble swallowing.  You feel like you are choking when you swallow.  You cough up blood or vomit blood.  You have trouble breathing.  You choke, cannot swallow, or cannot breathe if you lie flat.  You notice swelling or a rash on your body, face, or tongue. Summary  Hoarseness, also called dysphonia, is any abnormal change in your voice that can make it difficult to speak. Your voice may sound raspy, breathy, or strained.  Hoarseness is caused by a problem with your vocal cords (vocal folds).  Do not speak in a loud or harsh voice, use nicotine or tobacco products, or eat foods that give you heartburn.  If recommended by your health care provider, meet with a speech-language specialist. This information is not intended to replace advice given to you by your health care provider. Make sure you discuss any questions you have with your health care provider. Document Revised: 08/15/2017 Document Reviewed: 05/30/2017 Elsevier Patient Education  2020 Elsevier Inc.  

## 2020-07-13 DIAGNOSIS — R490 Dysphonia: Secondary | ICD-10-CM | POA: Diagnosis not present

## 2020-07-13 DIAGNOSIS — K219 Gastro-esophageal reflux disease without esophagitis: Secondary | ICD-10-CM | POA: Diagnosis not present

## 2020-08-15 DIAGNOSIS — Z23 Encounter for immunization: Secondary | ICD-10-CM | POA: Diagnosis not present

## 2020-08-31 ENCOUNTER — Ambulatory Visit: Payer: Self-pay | Admitting: Nurse Practitioner

## 2020-08-31 ENCOUNTER — Ambulatory Visit: Payer: Self-pay

## 2020-08-31 ENCOUNTER — Other Ambulatory Visit: Payer: Self-pay

## 2020-08-31 ENCOUNTER — Ambulatory Visit
Admission: RE | Admit: 2020-08-31 | Discharge: 2020-08-31 | Disposition: A | Discharge status: Home / Self Care | Payer: BC Managed Care – PPO | Attending: Nurse Practitioner | Admitting: Nurse Practitioner

## 2020-08-31 ENCOUNTER — Ambulatory Visit
Admission: RE | Admit: 2020-08-31 | Discharge: 2020-08-31 | Disposition: A | Discharge status: Home / Self Care | Payer: BC Managed Care – PPO | Source: Ambulatory Visit | Attending: Nurse Practitioner | Admitting: Nurse Practitioner

## 2020-08-31 ENCOUNTER — Encounter: Payer: Self-pay | Admitting: Nurse Practitioner

## 2020-08-31 VITALS — BP 112/74 | HR 88 | Temp 98.5°F | Resp 18

## 2020-08-31 DIAGNOSIS — R059 Cough, unspecified: Secondary | ICD-10-CM

## 2020-08-31 DIAGNOSIS — Z20822 Contact with and (suspected) exposure to covid-19: Secondary | ICD-10-CM

## 2020-08-31 DIAGNOSIS — R0602 Shortness of breath: Secondary | ICD-10-CM | POA: Diagnosis not present

## 2020-08-31 DIAGNOSIS — J811 Chronic pulmonary edema: Secondary | ICD-10-CM | POA: Diagnosis not present

## 2020-08-31 LAB — POC COVID19 BINAXNOW: SARS Coronavirus 2 Ag: NEGATIVE

## 2020-08-31 NOTE — Progress Notes (Signed)
Subjective:    Patient ID: Yolanda Fox, female    DOB: Oct 30, 1960, 59 y.o.   MRN: [id number]  HPI  59 year old   Presented to PCP 07/03/20 with complaints of dyspnea on exertion & loss of voice no cough at that time. Had EKG done in clinic stable at that time  She has been to ENT since that time, had a scope and was diagnosed with reflux at that time. Had also had a loss of voice for 3 weeks prior to ENT appointment that has since resolved. She has switched to taking her reflux medication in the evenings which has helped some with those symptoms.   Now has a cough with some sore throat, is on allergy medication as directed by ENT.   History of asthma, takes inhaler as needed She teaches water aerobics  Denies foreign travel, denies night sweats, fever or weight loss   Denies Smoking history  Today's Vitals   08/31/20 1534  BP: 112/74  Pulse: 88  Resp: 18  Temp: 98.5 F (36.9 C)  TempSrc: Oral  SpO2: 98%   There is no height or weight on file to calculate BMI.  Review of Systems  Constitutional: Negative.   HENT: Negative.   Respiratory: Positive for cough.   Neurological: Negative.        Objective:   Physical Exam Cardiovascular:     Rate and Rhythm: Normal rate and regular rhythm.     Heart sounds: Normal heart sounds.  Pulmonary:     Breath sounds: Normal air entry. Examination of the left-lower field reveals rales. Rales present. No decreased breath sounds, wheezing or rhonchi.     Comments: No wheezing on exam Neurological:     Mental Status: She is alert.    Chest XRAY results 08/31/20   FINDINGS: Lung volumes and mediastinal contours are stable since 2014 and normal. Stable surgical clips along the right trachea at the thoracic inlet. Visualized tracheal air column is within normal limits.  Questionable mid right lung pulmonary nodule/spiculation projecting at the confluence of the anterior right 4th and posterior 8th ribs. Approximately 12 mm.  This could be artifact. No pneumothorax, pulmonary edema, pleural effusion or other confluent pulmonary opacity.  No acute osseous abnormality identified. negative visible bowel gas pattern.  IMPRESSION: 1. Questionable 12 mm mid right lung pulmonary nodule versus artifact. Recommend a follow-up Chest CT (non-contrast should suffice). 2. Otherwise stable since 2014 radiographs, no other cardiopulmonary abnormality.  CT Results 09/01/20  EXAM: CT CHEST WITHOUT CONTRAST  TECHNIQUE: Multidetector CT imaging of the chest was performed following the standard protocol without IV contrast.  COMPARISON:  Chest radiographs 08/31/2020  FINDINGS: Cardiovascular: Normal caliber of the thoracic aorta. Normal heart size. Trace pericardial fluid.  Mediastinum/Nodes: Status post right hemithyroidectomy. No enlarged axillary, mediastinal, or hilar lymph nodes identified within limitations of noncontrast technique. Unremarkable esophagus.  Lungs/Pleura: No pleural effusion or pneumothorax. 2.0 x 1.1 cm (mean 1.6 cm) partly solid, partly ground-glass irregular nodule in the anterior right upper lobe extending to the minor fissure and lateral pleural surface (series 3, image 72). Similar partly solid, partly ground-glass irregular nodular density in the lingula extending to the major fissure measuring 2.9 x 1.9 cm (mean 2.4 cm, series 5, image 83). Mild scarring more inferiorly in the lingula. Small, faint subpleural ground-glass opacities laterally in the left lower lobe. Small tree-in-bud nodular densities in the posterior right upper lobe (for example series 3, images 50, 52, and 56). Patent central  airways.  Upper Abdomen: 8 mm hypodensity in the left hepatic lobe too small to fully characterize though favored to represent a cyst.  Musculoskeletal: No acute osseous abnormality or suspicious osseous lesion.  IMPRESSION: 1. Partly solid lung nodules measuring 2.4 cm in  the lingula and 1.6 cm in the right upper lobe. These may be infectious or inflammatory versus primary bronchogenic carcinoma. Non-contrast chest CT at 3 months is recommended. If nodules persist, subsequent management will be based upon the most suspicious nodule(s). This recommendation follows the consensus statement: Guidelines for Management of Incidental Pulmonary Nodules Detected on CT Images: From the Fleischner Society 2017; Radiology 2017; 284:228-243. 2. Mild tree-in-bud nodular densities in the right upper and left lower lobes and minimal subpleural ground-glass opacities in the left lower lobe, likely infectious/inflammatory. 3. No evidence of mediastinal lymphadenopathy.     Assessment & Plan:  XRAY resulted with evidence of nodule, patient sent for CT of chest- results as above  Will refer back to PCP and pulmonology for further investigation, current recommendation is 3 month f/u CT  Recommended workup with Pulmonology since radiology results evidence possibility of infectious/inflammatory process.   Results sent to patient via Mychart will contact by phone.

## 2020-09-01 ENCOUNTER — Other Ambulatory Visit: Payer: Self-pay | Admitting: Nurse Practitioner

## 2020-09-01 ENCOUNTER — Ambulatory Visit
Admission: RE | Admit: 2020-09-01 | Discharge: 2020-09-01 | Disposition: A | Discharge status: Home / Self Care | Payer: BC Managed Care – PPO | Source: Ambulatory Visit | Attending: Nurse Practitioner | Admitting: Nurse Practitioner

## 2020-09-01 DIAGNOSIS — R059 Cough, unspecified: Secondary | ICD-10-CM

## 2020-09-01 DIAGNOSIS — R918 Other nonspecific abnormal finding of lung field: Secondary | ICD-10-CM | POA: Diagnosis not present

## 2020-09-01 NOTE — Progress Notes (Signed)
XRAY results from yesterday  Spoke with patient will order CT chest outpatient `  CLINICAL DATA:  59 year old female with shortness of breath and cough for 2 months. Negative for COVID-19 today.  EXAM: CHEST - 2 VIEW  COMPARISON:  Chest radiographs 02/18/2013.  FINDINGS: Lung volumes and mediastinal contours are stable since 2014 and normal. Stable surgical clips along the right trachea at the thoracic inlet. Visualized tracheal air column is within normal limits.  Questionable mid right lung pulmonary nodule/spiculation projecting at the confluence of the anterior right 4th and posterior 8th ribs. Approximately 12 mm. This could be artifact. No pneumothorax, pulmonary edema, pleural effusion or other confluent pulmonary opacity.  No acute osseous abnormality identified. negative visible bowel gas pattern.  IMPRESSION: 1. Questionable 12 mm mid right lung pulmonary nodule versus artifact. Recommend a follow-up Chest CT (non-contrast should suffice). 2. Otherwise stable since 2014 radiographs, no other cardiopulmonary Abnormality.  Will go to Diagnostic Imaging today scheduled for a walk in

## 2020-09-04 ENCOUNTER — Encounter: Payer: Self-pay | Admitting: Physician Assistant

## 2020-09-04 DIAGNOSIS — R911 Solitary pulmonary nodule: Secondary | ICD-10-CM

## 2020-09-04 DIAGNOSIS — R059 Cough, unspecified: Secondary | ICD-10-CM

## 2020-09-21 ENCOUNTER — Encounter: Payer: Self-pay | Admitting: Emergency Medicine

## 2020-09-21 ENCOUNTER — Ambulatory Visit: Payer: BC Managed Care – PPO | Admitting: Emergency Medicine

## 2020-09-21 ENCOUNTER — Other Ambulatory Visit: Payer: Self-pay

## 2020-09-21 DIAGNOSIS — R918 Other nonspecific abnormal finding of lung field: Secondary | ICD-10-CM | POA: Diagnosis not present

## 2020-09-21 DIAGNOSIS — J4520 Mild intermittent asthma, uncomplicated: Secondary | ICD-10-CM

## 2020-09-21 DIAGNOSIS — J301 Allergic rhinitis due to pollen: Secondary | ICD-10-CM | POA: Diagnosis not present

## 2020-09-21 HISTORY — DX: Other nonspecific abnormal finding of lung field: R91.8

## 2020-09-21 NOTE — Assessment & Plan Note (Signed)
Irregularly-shaped nodule disease, question inflammatory noted on CT chest 09/01/2020.  Etiology unclear.  She may require bronchoscopy for culture data, tissue sampling.  I will repeat her CT chest in March to look for interval change.  At that time we will decide whether to perform bronchoscopy

## 2020-09-21 NOTE — Assessment & Plan Note (Signed)
This was apparently diagnosed clinically.  She does not recall having spirometry.  We will arrange for PFT to quantify degree of obstruction.  She has albuterol but also gets the side effects of jitteriness when she uses it so minimizes it.  Depending on PFT will decide whether additional asthma therapy, bronchodilators would be beneficial.  She does feel that she benefits from Singulair and we will continue this.

## 2020-09-21 NOTE — Addendum Note (Signed)
Addended by: Maurene Capes on: 09/21/2020 03:48 PM   Modules accepted: Orders

## 2020-09-21 NOTE — Assessment & Plan Note (Signed)
Continue Singulair, fluticasone nasal spray.  She might benefit from changing fluticasone to scheduled instead of as needed

## 2020-09-21 NOTE — Patient Instructions (Addendum)
We will arrange for pulmonary function testing at your next office visit Please continue Singulair, fluticasone nasal spray, famotidine as you have been taking them We will repeat your CT chest in March 2022 without contrast to follow pulmonary nodules Keep your albuterol available to use 1 to 2 puffs before exercise or in response to shortness of breath. Depending on your pulmonary function testing we will decide whether to try additional inhaler medication Follow with Dr. Delton Coombes next available with full pulmonary function testing on the same day.

## 2020-09-21 NOTE — Progress Notes (Signed)
Subjective:    Patient ID: Yolanda Fox, female    DOB: [birth year], 60 y.o.   MRN: [id number]  HPI 60 year old never smoker with a history of GERD, uninodular goiter, hypothyroidism, colonic polyps, cervical polyps, asthma that was diagnosed clinically. She was started on Singulair and loratadine, also uses nasal steroid prn. Has albuterol - has used it to pre-treat exercise, although makes her jittery.  She is referred today for evaluation abnormal CT chest and for her resp symptoms.  She was dealing with dyspnea on exertion, some upper airway instability and loss of her voice and October 2021, helped by treating her GERD more aggressively.  She underwent chest x-ray 08/31/2020 for recurrent coughing that showed a questionable 12 mm right middle lobe pulmonary nodule.  This prompted CT scan of the chest as below.  Her cough has slowly improved, has been impacted some by postnasal drip, upper respiratory type symptoms.  She was COVID-negative on 08/31/2020.  She notes exertional dyspnea when hiking, walking up steps and exercising  CT chest 09/01/2020 reviewed by me, shows a 2.0 x 1.1 cm partly solid and partly groundglass irregular nodule in the anterior right upper lobe extends to the minor fissure, 2.9 x 1.9 cm semisolid nodular density in the lingula, faint subpleural groundglass opacities in the left lower lobe, small tree-in-bud nodular densities posterior right upper lobe   Review of Systems As per HPI  Past Medical History:  Diagnosis Date  . Acute pharyngitis   . Benign neoplasm of colon   . Bicornuate uterus   . Degeneration of intervertebral disc, site unspecified   . Esophageal reflux   . External thrombosed hemorrhoids   . Hematuria, unspecified   . Nontoxic uninodular goiter   . Osteoarthrosis, unspecified whether generalized or localized, unspecified site   . Unspecified adjustment reaction   . Unspecified hypothyroidism   . Unspecified vitamin D deficiency       Family History  Problem Relation Age of Onset  . Heart disease Father   . Breast cancer Neg Hx     No lung cancer  Social History   Socioeconomic History  . Marital status: Married    Spouse name: Not on file  . Number of children: Not on file  . Years of education: Not on file  . Highest education level: Not on file  Occupational History  . Not on file  Tobacco Use  . Smoking status: Never Smoker  . Smokeless tobacco: Never Used  Vaping Use  . Vaping Use: Never used  Substance and Sexual Activity  . Alcohol use: Yes    Alcohol/week: 0.0 standard drinks    Comment: occassionally  . Drug use: No  . Sexual activity: Not on file  Other Topics Concern  . Not on file  Social History Narrative  . 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    Allergies  Allergen Reactions  . Codeine   . Penicillins      Outpatient Medications Prior to Visit  Medication Sig Dispense Refill  . albuterol (VENTOLIN HFA) 108 (90 Base) MCG/ACT inhaler Inhale 1-2 puffs into the lungs every 6 (  six) hours as needed for wheezing or shortness of breath. 18 g 1  . aspirin 81 MG tablet Take by mouth 3 (three) times a week.     . cholecalciferol (VITAMIN D3) 25 MCG (1000 UNIT) tablet     . fluticasone (FLONASE) 50 MCG/ACT nasal spray Place into both nostrils at bedtime as needed for allergies or rhinitis.    . Ivermectin (SOOLANTRA) 1 % CREA Apply 1 application topically at bedtime. 45 g 6  . levothyroxine (SYNTHROID) 100 MCG tablet TAKE 1 TABLET DAILY BEFORE BREAKFAST 90 tablet 3  . loratadine (CLARITIN) 10 MG tablet Take 10 mg by mouth daily.    Marnee Spring  Omega-3 Krill Oil 350 MG CAPS     . montelukast (SINGULAIR) 10 MG tablet Take 1 tablet (10 mg total) by mouth at bedtime. 90 tablet 4  . Multiple Vitamin (MULTIVITAMIN) capsule Take 1 capsule by mouth daily.    . famotidine (PEPCID) 20 MG tablet Take by mouth.     No facility-administered medications prior to visit.         Objective:   Physical Exam Vitals:   09/21/20 1512  BP: 118/74  Pulse: 67  Temp: (!) 97.1 F (36.2 C)  TempSrc: Temporal  SpO2: 99%  Weight: 164 lb (74.4 kg)  Height: 5' 6.5" (1.689 m)   Gen: Pleasant, well-nourished, in no distress,  normal affect  ENT: No lesions,  mouth clear,  oropharynx clear, no postnasal drip  Neck: No JVD, no stridor  Lungs: No use of accessory muscles, no crackles or wheezing on normal respiration, no wheeze on forced expiration  Cardiovascular: RRR, heart sounds normal, no murmur or gallops, no peripheral edema  Musculoskeletal: No deformities, no cyanosis or clubbing  Neuro: alert, awake, non focal  Skin: Warm, no lesions or rash      Assessment & Plan:  RAD (reactive airway disease) This was apparently diagnosed clinically.  She does not recall having spirometry.  We will arrange for PFT to quantify degree of obstruction.  She has albuterol but also gets the side effects of jitteriness when she uses it so minimizes it.  Depending on PFT will decide whether additional asthma therapy, bronchodilators would be beneficial.  She does feel that she benefits from Singulair and we will continue this.  Allergic rhinitis Continue Singulair, fluticasone nasal spray.  She might benefit from changing fluticasone to scheduled instead of as needed  Pulmonary nodules/lesions, multiple Irregularly-shaped nodule disease, question inflammatory noted on CT chest 09/01/2020.  Etiology unclear.  She may require bronchoscopy for culture data, tissue sampling.  I will repeat her CT chest in March to look for interval change.  At that time we  will decide whether to perform bronchoscopy  Baltazar Apo, MD, PhD 09/21/2020, 3:43 PM [facility] [phone] or if no answer [phone]

## 2020-10-05 ENCOUNTER — Institutional Professional Consult (permissible substitution): Payer: BC Managed Care – PPO | Admitting: Pulmonary Disease

## 2020-10-17 ENCOUNTER — Ambulatory Visit: Payer: BC Managed Care – PPO | Admitting: Emergency Medicine

## 2020-10-17 ENCOUNTER — Encounter: Payer: Self-pay | Admitting: Emergency Medicine

## 2020-10-17 ENCOUNTER — Other Ambulatory Visit: Payer: Self-pay

## 2020-10-17 ENCOUNTER — Ambulatory Visit (INDEPENDENT_AMBULATORY_CARE_PROVIDER_SITE_OTHER): Payer: BC Managed Care – PPO | Admitting: Emergency Medicine

## 2020-10-17 DIAGNOSIS — R0602 Shortness of breath: Secondary | ICD-10-CM

## 2020-10-17 DIAGNOSIS — J4520 Mild intermittent asthma, uncomplicated: Secondary | ICD-10-CM

## 2020-10-17 DIAGNOSIS — R918 Other nonspecific abnormal finding of lung field: Secondary | ICD-10-CM | POA: Diagnosis not present

## 2020-10-17 LAB — PULMONARY FUNCTION TEST
DL/VA % pred: 137 %
DL/VA: 5.67 ml/min/mmHg/L
DLCO cor % pred: 117 %
DLCO cor: 25.8 ml/min/mmHg
DLCO unc % pred: 117 %
DLCO unc: 25.8 ml/min/mmHg
FEF 25-75 Post: 2.27 L/s
FEF 25-75 Pre: 2.46 L/s
FEF2575-%Change-Post: -7 %
FEF2575-%Pred-Post: 92 %
FEF2575-%Pred-Pre: 100 %
FEV1-%Change-Post: 0 %
FEV1-%Pred-Post: 85 %
FEV1-%Pred-Pre: 86 %
FEV1-Post: 2.38 L
FEV1-Pre: 2.4 L
FEV1FVC-%Change-Post: 5 %
FEV1FVC-%Pred-Pre: 101 %
FEV6-%Change-Post: -6 %
FEV6-%Pred-Post: 81 %
FEV6-%Pred-Pre: 87 %
FEV6-Post: 2.83 L
FEV6-Pre: 3.02 L
FEV6FVC-%Pred-Post: 103 %
FEV6FVC-%Pred-Pre: 103 %
FVC-%Change-Post: -5 %
FVC-%Pred-Post: 79 %
FVC-%Pred-Pre: 84 %
FVC-Post: 2.84 L
FVC-Pre: 3.02 L
Post FEV1/FVC ratio: 84 %
Post FEV6/FVC ratio: 100 %
Pre FEV1/FVC ratio: 79 %
Pre FEV6/FVC Ratio: 100 %
RV % pred: 97 %
RV: 2.09 L
TLC % pred: 93 %
TLC: 5.08 L

## 2020-10-17 NOTE — Assessment & Plan Note (Signed)
Pulmonary function testing today shows normal airflows although there is some intermittent flattening of her inspiratory curve on the flow volume loop.  Overall normal without a bronchodilator response.  She does not respond to albuterol.  Both argue against asthma although she may have exercise-induced bronchospasm.  I would like to perform a cardiopulmonary exercise test to evaluate further.  She agrees.  She is interested in trying to stop her Singulair as the asthma diagnosis is in question.  She will do so we will follow her symptoms.

## 2020-10-17 NOTE — Progress Notes (Signed)
Subjective:    Patient ID: Yolanda Fox, female    DOB: 06-15-1961, 60 y.o.   MRN: [id number]  HPI   ROV 10/17/20 --this is a follow-up visit for 60 year old never smoker for evaluation of shortness of breath, suspected clinical asthma.  Also for CT scan of the chest that identified a 2.0 x 1.1 cm partly groundglass irregular nodule in the anterior right upper lobe, 2.9 x 1.9 cm semisolid lingular nodular density, bilateral faint subpleural groundglass and tree-in-bud nodular densities.  She is on Singulair.  Has jitteriness when she uses albuterol. She reports that her cough is better. She still has same exertional SOB with stairs, climbing stairs. Quickly improves. 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   Pulmonary function testing performed today reviewed by me, shows grossly normal airflows without a bronchodilator response, normal lung volumes and a normal diffusion capacity.  Her flow volume loop shows variable inspiratory flattening on some of her trials.   Review of Systems As per HPI   Allergies  Allergen Reactions  . Codeine   . Penicillins      Outpatient Medications Prior to Visit  Medication Sig Dispense Refill  . albuterol (VENTOLIN HFA) 108 (90 Base) MCG/ACT inhaler Inhale 1-2 puffs into the lungs every 6 (six) hours as needed for wheezing or shortness of breath. 18 g 1  . aspirin 81 MG tablet Take by mouth 3 (three) times a week.     . cholecalciferol (VITAMIN D3) 25 MCG (1000 UNIT) tablet     . fluticasone (FLONASE) 50 MCG/ACT nasal spray Place into both nostrils at bedtime as needed for allergies or rhinitis.    . Ivermectin (SOOLANTRA) 1 % CREA Apply 1 application topically at bedtime. 45 g 6  . levothyroxine (SYNTHROID) 100 MCG tablet TAKE 1 TABLET DAILY BEFORE BREAKFAST 90 tablet 3  . loratadine (CLARITIN) 10 MG tablet Take 10 mg by mouth daily.    [place] Omega-3 Krill Oil 350 MG CAPS     . montelukast (SINGULAIR) 10 MG tablet Take 1  tablet (10 mg total) by mouth at bedtime. 90 tablet 4  . Multiple Vitamin (MULTIVITAMIN) capsule Take 1 capsule by mouth daily.    . famotidine (PEPCID) 20 MG tablet Take by mouth.     No facility-administered medications prior to visit.         Objective:   Physical Exam Vitals:   10/17/20 1610  BP: 118/74  Pulse: 72  Temp: (!) 97.2 F (36.2 C)  SpO2: 100%  Weight: 169 lb (76.7 kg)  Height: 5' 6.5" (1.689 m)   Gen: Pleasant, well-nourished, in no distress,  normal affect  ENT: No lesions,  mouth clear,  oropharynx clear, no postnasal drip  Neck: No JVD, no stridor  Lungs: No use of accessory muscles, no crackles or wheezing on normal respiration, no wheeze on forced expiration  Cardiovascular: RRR, heart sounds normal, no murmur or gallops, no peripheral edema  Musculoskeletal: No deformities, no cyanosis or clubbing  Neuro: alert, awake, non focal  Skin: Warm, no lesions or rash      Assessment & Plan:  Dyspnea Pulmonary function testing today shows normal airflows although there is some intermittent flattening of her inspiratory curve on the flow volume loop.  Overall normal without a bronchodilator response.  She does not respond to albuterol.  Both argue against asthma although she may have exercise-induced bronchospasm.  I would like to perform a cardiopulmonary exercise test to evaluate  further.  She agrees.  She is interested in trying to stop her Singulair as the asthma diagnosis is in question.  She will do so we will follow her symptoms.  Pulmonary nodules/lesions, multiple Inflammatory changes on her CT scan from December.  We are planning to repeat in March.  Depending on persistence we may decide to pursue bronchoscopy for culture data, tissue sampling.  Baltazar Apo, MD, PhD 10/17/2020, 4:37 PM [facility] [phone] or if no answer [phone]

## 2020-10-17 NOTE — Assessment & Plan Note (Signed)
Inflammatory changes on her CT scan from December.  We are planning to repeat in March.  Depending on persistence we may decide to pursue bronchoscopy for culture data, tissue sampling.

## 2020-10-17 NOTE — Patient Instructions (Addendum)
We will arrange for a cardiopulmonary exercise test.  OK to try stopping your singulair to see if you miss it. Consider restarting if you have increased shortness of breath or increased mucous / congestion.  Keep albuterol available to use 2 puffs up to every 4 hours if needed for shortness of breath, chest tightness, wheezing.  We will repeat your CT scan of the chest in March 2022 to compare with your prior. Follow Dr. Lamonte Sakai in March to discuss your exercise test and your CT scan.

## 2020-10-17 NOTE — Addendum Note (Signed)
Addended by: Gavin Potters R on: 10/17/2020 04:59 PM   Modules accepted: Orders

## 2020-10-17 NOTE — Progress Notes (Signed)
Full PFT performed today. °

## 2020-11-20 ENCOUNTER — Ambulatory Visit (INDEPENDENT_AMBULATORY_CARE_PROVIDER_SITE_OTHER)
Admission: RE | Admit: 2020-11-20 | Discharge: 2020-11-20 | Disposition: A | Discharge status: Home / Self Care | Payer: BC Managed Care – PPO | Source: Ambulatory Visit | Attending: Emergency Medicine | Admitting: Emergency Medicine

## 2020-11-20 ENCOUNTER — Ambulatory Visit (HOSPITAL_COMMUNITY): Payer: BC Managed Care – PPO | Attending: Emergency Medicine

## 2020-11-20 ENCOUNTER — Other Ambulatory Visit: Payer: Self-pay

## 2020-11-20 DIAGNOSIS — R0602 Shortness of breath: Secondary | ICD-10-CM | POA: Insufficient documentation

## 2020-11-20 DIAGNOSIS — R918 Other nonspecific abnormal finding of lung field: Secondary | ICD-10-CM | POA: Diagnosis not present

## 2020-11-20 DIAGNOSIS — I700 Atherosclerosis of aorta: Secondary | ICD-10-CM | POA: Diagnosis not present

## 2020-11-24 ENCOUNTER — Ambulatory Visit: Payer: BC Managed Care – PPO | Admitting: Emergency Medicine

## 2020-11-24 ENCOUNTER — Other Ambulatory Visit: Payer: Self-pay

## 2020-11-24 ENCOUNTER — Encounter: Payer: Self-pay | Admitting: Emergency Medicine

## 2020-11-24 DIAGNOSIS — J301 Allergic rhinitis due to pollen: Secondary | ICD-10-CM

## 2020-11-24 DIAGNOSIS — R0602 Shortness of breath: Secondary | ICD-10-CM

## 2020-11-24 DIAGNOSIS — R918 Other nonspecific abnormal finding of lung field: Secondary | ICD-10-CM

## 2020-11-24 NOTE — Assessment & Plan Note (Signed)
Inflammatory and transient.  Resolved on her CT scan from 11/20/2020.  No indication to follow serial scans at this point

## 2020-11-24 NOTE — Patient Instructions (Signed)
Your CT scan of the chest confirms clearance of any inflammatory changes that were seen on your prior.  This is good news. Your cardiopulmonary exercise test shows great cardiac response and pulmonary response to exercise, great exercise tolerance. Continue your Claritin 10 mg once daily. Continue your Flonase nasal spray when you need it Agree with discontinuation of Singulair Follow Dr. Lamonte Sakai if needed for any changes in your symptoms.

## 2020-11-24 NOTE — Assessment & Plan Note (Signed)
Great exercise tolerance without any evidence of a pulmonary or cardiac limitation.  Reassured her about this.  She does still have exertional shortness of breath with heavier exertion, suspect some degree of at least relative deconditioning although she is in overall great shape.

## 2020-11-24 NOTE — Progress Notes (Signed)
   Subjective:    Patient ID: Yolanda Fox, female    DOB: Apr 12, 1961, 60 y.o.   MRN: [id number]  HPI   ROV 10/17/20 --this is a follow-up visit for 60 year old never smoker for evaluation of shortness of breath, suspected clinical asthma.  Also for CT scan of the chest that identified a 2.0 x 1.1 cm partly groundglass irregular nodule in the anterior right upper lobe, 2.9 x 1.9 cm semisolid lingular nodular density, bilateral faint subpleural groundglass and tree-in-bud nodular densities.  She is on Singulair.  Has jitteriness when she uses albuterol. She reports that her cough is better. She still has same exertional SOB with stairs, climbing stairs. Quickly improves. 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   Pulmonary function testing performed today reviewed by me, shows grossly normal airflows without a bronchodilator response, normal lung volumes and a normal diffusion capacity.  Her flow volume loop shows variable inspiratory flattening on some of her trials.   ROV 11/24/20 --60 year old follow-up visit with normal spirometry, dyspnea.  Also following bilateral groundglass/semisolid nodular densities with some tree-in-bud changes. 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   Repeat CT scan of the chest performed 11/20/2020 reviewed by me, shows resolution of the inflammatory foci.  There may be some minimal interstitial thickening remaining in the posterior left upper lobe following large-scale clearing.  Cardiopulmonary exercise testing performed 11/20/2020 reviewed by me showed excellent functional capacity compared to norms, no indication to cardiopulmonary limitation there was some mild exercise associated hypertension.   Review of Systems As per HPI     Objective:   Physical Exam Vitals:   11/24/20 0900  BP: 118/72  Pulse: 82  Temp: 97.6 F (36.4 C)  TempSrc: Temporal  SpO2: 99%  Weight: 169 lb 12.8 oz (77 kg)  Height: 5' 6.5"  (1.689 m)   Gen: Pleasant, well-nourished, in no distress,  normal affect  ENT: No lesions,  mouth clear,  oropharynx clear, no postnasal drip  Neck: No JVD, no stridor  Lungs: No use of accessory muscles, no crackles or wheezing on normal respiration, no wheeze on forced expiration  Cardiovascular: RRR, heart sounds normal, no murmur or gallops, no peripheral edema  Musculoskeletal: No deformities, no cyanosis or clubbing  Neuro: alert, awake, non focal  Skin: Warm, no lesions or rash      Assessment & Plan:  Pulmonary nodules/lesions, multiple Inflammatory and transient.  Resolved on her CT scan from 11/20/2020.  No indication to follow serial scans at this point  Dyspnea Great exercise tolerance without any evidence of a pulmonary or cardiac limitation.  Reassured her about this.  She does still have exertional shortness of breath with heavier exertion, suspect some degree of at least relative deconditioning although she is in overall great shape.  Allergic rhinitis Stop Singulair without any negative repercussions.  Continue fluticasone nasal spray when she needs it.  Continue loratadine every day  Baltazar Apo, MD, PhD 11/24/2020, 9:23 AM [facility] [phone] or if no answer [phone]

## 2020-11-24 NOTE — Assessment & Plan Note (Signed)
Stop Singulair without any negative repercussions.  Continue fluticasone nasal spray when she needs it.  Continue loratadine every day

## 2020-12-11 ENCOUNTER — Ambulatory Visit: Payer: BC Managed Care – PPO | Admitting: Dermatology

## 2020-12-11 ENCOUNTER — Other Ambulatory Visit: Payer: Self-pay

## 2020-12-11 DIAGNOSIS — D492 Neoplasm of unspecified behavior of bone, soft tissue, and skin: Secondary | ICD-10-CM

## 2020-12-11 DIAGNOSIS — D239 Other benign neoplasm of skin, unspecified: Secondary | ICD-10-CM

## 2020-12-11 DIAGNOSIS — Z1283 Encounter for screening for malignant neoplasm of skin: Secondary | ICD-10-CM

## 2020-12-11 DIAGNOSIS — D2372 Other benign neoplasm of skin of left lower limb, including hip: Secondary | ICD-10-CM

## 2020-12-11 DIAGNOSIS — D225 Melanocytic nevi of trunk: Secondary | ICD-10-CM | POA: Diagnosis not present

## 2020-12-11 DIAGNOSIS — D229 Melanocytic nevi, unspecified: Secondary | ICD-10-CM

## 2020-12-11 DIAGNOSIS — D2362 Other benign neoplasm of skin of left upper limb, including shoulder: Secondary | ICD-10-CM

## 2020-12-11 DIAGNOSIS — D1800 Hemangioma unspecified site: Secondary | ICD-10-CM

## 2020-12-11 DIAGNOSIS — L719 Rosacea, unspecified: Secondary | ICD-10-CM

## 2020-12-11 DIAGNOSIS — L821 Other seborrheic keratosis: Secondary | ICD-10-CM

## 2020-12-11 DIAGNOSIS — L814 Other melanin hyperpigmentation: Secondary | ICD-10-CM

## 2020-12-11 DIAGNOSIS — L578 Other skin changes due to chronic exposure to nonionizing radiation: Secondary | ICD-10-CM

## 2020-12-11 DIAGNOSIS — L820 Inflamed seborrheic keratosis: Secondary | ICD-10-CM | POA: Diagnosis not present

## 2020-12-11 DIAGNOSIS — D2261 Melanocytic nevi of right upper limb, including shoulder: Secondary | ICD-10-CM

## 2020-12-11 DIAGNOSIS — D2272 Melanocytic nevi of left lower limb, including hip: Secondary | ICD-10-CM

## 2020-12-11 NOTE — Progress Notes (Signed)
Follow-Up Visit   Subjective  ARDYN FORGE is a 60 y.o. female who presents for the following: Total body skin exam (No hx of skin ca) and Rosacea (Face, soolantra qhs). She has an itchy growth on her L shoulder and her R forehead.  Also an itchy spot on her back.   The following portions of the chart were reviewed this encounter and updated as appropriate:       Review of Systems:  No other skin or systemic complaints except as noted in HPI or Assessment and Plan.  Objective  Well appearing patient in no apparent distress; mood and affect are within normal limits.  A full examination was performed including scalp, head, eyes, ears, nose, lips, neck, chest, axillae, abdomen, back, buttocks, bilateral upper extremities, bilateral lower extremities, hands, feet, fingers, toes, fingernails, and toenails. All findings within normal limits unless otherwise noted below.  Objective  face: Erythema and telangiectasias nose, malar cheeks  Objective  Left ant shoulder x 1, R forehead x 1 (2): Erythematous keratotic or waxy stuck-on papule   Right Popliteal Fossa; Left lateral breast; Right lower flank; Left Medial Thigh; Left Lower Back  Objective  Left Lower Back: 2.71mm med dark brown macule  Left Medial Thigh: 2.19mm med brown macule  Left lateral breast: 8.0 x 5.56mm speckled brown macule  Right lower flank: 7.0 x 3.51mm brown pap  Right Popliteal Fossa: 3.28mm brown pap 2 tone  Images        Objective  L medial calf, L post shoudler: Firm pink/brown papulenodules with dimple sign.   Objective  Left Lower Back: 6.70mm flesh pap      Assessment & Plan    Lentigines - Scattered tan macules - Due to sun exposure - Benign-appering, observe - Recommend daily broad spectrum sunscreen SPF 30+ to sun-exposed areas, reapply every 2 hours as needed. - Call for any changes  Seborrheic Keratoses - Stuck-on, waxy, tan-brown papules and/or plaques  -  Benign-appearing - Discussed benign etiology and prognosis. - Observe - Call for any changes  Melanocytic Nevi - Tan-brown and/or pink-flesh-colored symmetric macules and papules - Benign appearing on exam today - Observation - Call clinic for new or changing moles - Recommend daily use of broad spectrum spf 30+ sunscreen to sun-exposed areas.   Hemangiomas - Red papules - Discussed benign nature - Observe - Call for any changes  Actinic Damage - Chronic condition, secondary to cumulative UV/sun exposure - diffuse scaly erythematous macules with underlying dyspigmentation - Recommend daily broad spectrum sunscreen SPF 30+ to sun-exposed areas, reapply every 2 hours as needed.  - Staying in the shade or wearing long sleeves, sun glasses (UVA+UVB protection) and wide brim hats (4-inch brim around the entire circumference of the hat) are also recommended for sun protection.  - Call for new or changing lesions.  Skin cancer screening performed today.   Rosacea face  Rosacea is a chronic progressive skin condition usually affecting the face of adults, causing redness and/or acne bumps. It is treatable but not curable. It sometimes affects the eyes (ocular rosacea) as well. It may respond to topical and/or systemic medication and can flare with stress, sun exposure, alcohol, exercise and some foods.  Daily application of broad spectrum spf 30+ sunscreen to face is recommended to reduce flares.  Chronic condition with duration or expected duration over one year. Currently well-controlled.  Cont Soolantra qhs to face  Other Related Medications Ivermectin (SOOLANTRA) 1 % CREA  Inflamed seborrheic keratosis (2)  Left ant shoulder x 1, R forehead x 1  Destruction of lesion - Left ant shoulder x 1, R forehead x 1  Destruction method: cryotherapy   Informed consent: discussed and consent obtained   Lesion destroyed using liquid nitrogen: Yes   Region frozen until ice ball extended  beyond lesion: Yes   Outcome: patient tolerated procedure well with no complications   Post-procedure details: wound care instructions given    Nevus (5) Right Popliteal Fossa; Left lateral breast; Right lower flank; Left Medial Thigh; Left Lower Back  Benign-appearing.  Observation.  Call clinic for new or changing moles.  Recommend daily use of broad spectrum spf 30+ sunscreen to sun-exposed areas.     Dermatofibroma L medial calf, L post shoudler  Benign, observe.    Neoplasm of skin Left Lower Back  Epidermal / dermal shaving  Lesion diameter (cm):  0.6 Informed consent: discussed and consent obtained   Timeout: patient name, date of birth, surgical site, and procedure verified   Procedure prep:  Patient was prepped and draped in usual sterile fashion Prep type:  Isopropyl alcohol Anesthesia: the lesion was anesthetized in a standard fashion   Anesthetic:  1% lidocaine w/ epinephrine 1-100,000 buffered w/ 8.4% NaHCO3 Instrument used: flexible razor blade   Hemostasis achieved with: pressure, aluminum chloride and electrodesiccation   Outcome: patient tolerated procedure well   Post-procedure details: sterile dressing applied and wound care instructions given   Dressing type: bandage and petrolatum    Specimen 1 - Surgical pathology Differential Diagnosis: D48.5 Irritated nevus r/o Atypia  Check Margins: yes 6.59mm flesh pap  Return in about 1 year (around 12/11/2021) for TBSE, Rosacea.  I, Othelia Pulling, RMA, am acting as scribe for Brendolyn Patty, MD . Documentation: I have reviewed the above documentation for accuracy and completeness, and I agree with the above.  Brendolyn Patty MD

## 2020-12-11 NOTE — Patient Instructions (Addendum)
If you have any questions or concerns for your doctor, please call our main line at [phone] and press option 4 to reach your doctor's medical assistant. If no one answers, please leave a voicemail as directed and we will return your call as soon as possible. Messages left after 4 pm will be answered the following business day.   You may also send [place] a message via [place]. We typically respond to MyChart messages within 1-2 business days.  For prescription refills, please ask your pharmacy to contact our office. Our fax number is [phone].  If you have an urgent issue when the clinic is closed that cannot wait until the next business day, you can page your doctor at the number below.    Please note that while we do our best to be available for urgent issues outside of office hours, we are not available 24/7.   If you have an urgent issue and are unable to reach [place], you may choose to seek medical care at your doctor's office, retail clinic, urgent care center, or emergency room.  If you have a medical emergency, please immediately call 911 or go to the emergency department.  Pager Numbers  - Dr. Nehemiah Massed: [phone]  - Dr. Laurence Ferrari: [phone]  - Dr. Nicole Kindred: [phone]  In the event of inclement weather, please call our main line at [phone] for an update on the status of any delays or closures.  Dermatology Medication Tips: Please keep the boxes that topical medications come in in order to help keep track of the instructions about where and how to use these. Pharmacies typically print the medication instructions only on the boxes and not directly on the medication tubes.   If your medication is too expensive, please contact our office at [phone] option 4 or send [place] a message through [place].   We are unable to tell what your co-pay for medications will be in advance as this is different depending on your insurance coverage. However, we may be able to find a substitute  medication at lower cost or fill out paperwork to get insurance to cover a needed medication.   If a prior authorization is required to get your medication covered by your insurance company, please allow [place] 1-2 business days to complete this process.  Drug prices often vary depending on where the prescription is filled and some pharmacies may offer cheaper prices.  The website www.goodrx.com contains coupons for medications through different pharmacies. The prices here do not account for what the cost may be with help from insurance (it may be cheaper with your insurance), but the website can give you the price if you did not use any insurance.  - You can print the associated coupon and take it with your prescription to the pharmacy.  - You may also stop by our office during regular business hours and pick up a GoodRx coupon card.  - If you need your prescription sent electronically to a different pharmacy, notify our office through [facility] or by phone at [phone] option 4.   Cryotherapy Aftercare  . Wash gently with soap and water everyday.   Marland Kitchen Apply Vaseline and Band-Aid daily until healed.    Wound Care Instructions  1. Cleanse wound gently with soap and water once a day then pat dry with clean gauze. Apply a thing coat of Petrolatum (petroleum jelly, "Vaseline") over the wound (unless you have an allergy to this). We recommend that you use a new, sterile tube of  Vaseline. Do not pick or remove scabs. Do not remove the yellow or white "healing tissue" from the base of the wound.  2. Cover the wound with fresh, clean, nonstick gauze and secure with paper tape. You may use Band-Aids in place of gauze and tape if the would is small enough, but would recommend trimming much of the tape off as there is often too much. Sometimes Band-Aids can irritate the skin.  3. You should call the office for your biopsy report after 1 week if you have not already been contacted.  4. If you  experience any problems, such as abnormal amounts of bleeding, swelling, significant bruising, significant pain, or evidence of infection, please call the office immediately.  5. FOR ADULT SURGERY PATIENTS: If you need something for pain relief you may take 1 extra strength Tylenol (acetaminophen) AND 2 Ibuprofen (200mg  each) together every 4 hours as needed for pain. (do not take these if you are allergic to them or if you have a reason you should not take them.) Typically, you may only need pain medication for 1 to 3 days.     Melanoma ABCDEs  Melanoma is the most dangerous type of skin cancer, and is the leading cause of death from skin disease.  You are more likely to develop melanoma if you:  Have light-colored skin, light-colored eyes, or red or blond hair  Spend a lot of time in the sun  Tan regularly, either outdoors or in a tanning bed  Have had blistering sunburns, especially during childhood  Have a close family member who has had a melanoma  Have atypical moles or large birthmarks  Early detection of melanoma is key since treatment is typically straightforward and cure rates are extremely high if we catch it early.   The first sign of melanoma is often a change in a mole or a new dark spot.  The ABCDE system is a way of remembering the signs of melanoma.  A for asymmetry:  The two halves do not match. B for border:  The edges of the growth are irregular. C for color:  A mixture of colors are present instead of an even brown color. D for diameter:  Melanomas are usually (but not always) greater than 68mm - the size of a pencil eraser. E for evolution:  The spot keeps changing in size, shape, and color.  Please check your skin once per month between visits. You can use a small mirror in front and a large mirror behind you to keep an eye on the back side or your body.   If you see any new or changing lesions before your next follow-up, please call to schedule a  visit.  Please continue daily skin protection including broad spectrum sunscreen SPF 30+ to sun-exposed areas, reapplying every 2 hours as needed when you're outdoors.   Staying in the shade or wearing long sleeves, sun glasses (UVA+UVB protection) and wide brim hats (4-inch brim around the entire circumference of the hat) are also recommended for sun protection.

## 2020-12-12 ENCOUNTER — Telehealth: Payer: Self-pay

## 2020-12-12 NOTE — Telephone Encounter (Signed)
-----   Message from Brendolyn Patty, MD sent at 12/12/2020  5:16 PM EDT ----- Skin , left lower back, excision MELANOCYTIC NEVUS, INTRADERMAL TYPE, IRRITATED  Shave removal, not excision. Benign irritated mole

## 2020-12-12 NOTE — Telephone Encounter (Signed)
Left patient message advising bx benign irritated nevus.  If she had any questions to call the office./sh

## 2021-02-06 ENCOUNTER — Other Ambulatory Visit: Payer: Self-pay

## 2021-02-06 DIAGNOSIS — L719 Rosacea, unspecified: Secondary | ICD-10-CM

## 2021-02-06 MED ORDER — IVERMECTIN 1 % EX CREA: 1.0000 "application " | TOPICAL_CREAM | Freq: Every evening | CUTANEOUS | 2 refills | Status: DC

## 2021-02-06 NOTE — Progress Notes (Signed)
RX RF approved per fax.

## 2021-06-19 DIAGNOSIS — Z23 Encounter for immunization: Secondary | ICD-10-CM | POA: Diagnosis not present

## 2021-07-11 ENCOUNTER — Ambulatory Visit: Payer: BC Managed Care – PPO

## 2021-07-18 ENCOUNTER — Ambulatory Visit (INDEPENDENT_AMBULATORY_CARE_PROVIDER_SITE_OTHER): Payer: BC Managed Care – PPO

## 2021-07-18 ENCOUNTER — Other Ambulatory Visit: Payer: Self-pay

## 2021-07-18 DIAGNOSIS — Z23 Encounter for immunization: Secondary | ICD-10-CM | POA: Diagnosis not present

## 2021-07-18 NOTE — Progress Notes (Signed)
Patient here today for Shingles Vaccine. This is her first one.  Patient to return for second vaccine January 24,2023.

## 2021-07-24 ENCOUNTER — Other Ambulatory Visit: Payer: Self-pay | Admitting: Physician Assistant

## 2021-07-24 DIAGNOSIS — E039 Hypothyroidism, unspecified: Secondary | ICD-10-CM

## 2021-08-14 DIAGNOSIS — Z23 Encounter for immunization: Secondary | ICD-10-CM | POA: Diagnosis not present

## 2021-09-24 ENCOUNTER — Other Ambulatory Visit: Payer: Self-pay | Admitting: Family Medicine

## 2021-09-24 DIAGNOSIS — E039 Hypothyroidism, unspecified: Secondary | ICD-10-CM

## 2021-09-25 NOTE — Telephone Encounter (Signed)
Requested medication (s) are due for refill today: yes  Requested medication (s) are on the active medication list: yes  Last refill:  07/30/21  Future visit scheduled: 10/09/21  Notes to clinic:  failed protocol of visit within 12 months and labs within 360 days. Does have upcoming appt, please assess.  Requested Prescriptions  Pending Prescriptions Disp Refills   levothyroxine (SYNTHROID) 100 MCG tablet [Pharmacy Med Name: L-THYROXINE TABS 100MCG] 30 tablet 11    Sig: TAKE 1 TABLET DAILY BEFORE BREAKFAST (OFFICE VISIT NEEDED PRIOR TO FURTHER REFILLS)     Endocrinology:  Hypothyroid Agents Failed - 09/24/2021  5:59 PM      Failed - TSH needs to be rechecked within 3 months after an abnormal result. Refill until TSH is due.      Failed - TSH in normal range and within 360 days    TSH  Date Value Ref Range Status  05/01/2020 2.470 0.450 - 4.500 uIU/mL Final          Failed - Valid encounter within last 12 months    Recent Outpatient Visits           1 year ago DOE (dyspnea on exertion)   [place], Clearnce Sorrel, PA-C   1 year ago Annual physical exam   [facility], Clearnce Sorrel, [place]   2 years ago Annual physical exam   [facility], Clearnce Sorrel, [place]   3 years ago [facility] arthritis   [place], Clearnce Sorrel, [place]   3 years ago Annual physical exam   [place], Clearnce Sorrel, [place]       Future Appointments             In 2 weeks Bacigalupo, Dionne Bucy, MD [facility], [place]

## 2021-09-26 ENCOUNTER — Encounter: Payer: Self-pay | Admitting: Family Medicine

## 2021-09-26 DIAGNOSIS — E039 Hypothyroidism, unspecified: Secondary | ICD-10-CM

## 2021-09-27 MED ORDER — LEVOTHYROXINE SODIUM 100 MCG PO TABS: 100.0000 ug | ORAL_TABLET | Freq: Every day | ORAL | 0 refills | Status: DC

## 2021-10-09 ENCOUNTER — Ambulatory Visit (INDEPENDENT_AMBULATORY_CARE_PROVIDER_SITE_OTHER): Payer: BC Managed Care – PPO | Admitting: Family Medicine

## 2021-10-09 ENCOUNTER — Other Ambulatory Visit (HOSPITAL_COMMUNITY)
Admission: RE | Admit: 2021-10-09 | Discharge: 2021-10-09 | Disposition: A | Discharge status: Home / Self Care | Payer: BC Managed Care – PPO | Source: Ambulatory Visit | Attending: Family Medicine | Admitting: Family Medicine

## 2021-10-09 ENCOUNTER — Other Ambulatory Visit: Payer: Self-pay

## 2021-10-09 ENCOUNTER — Encounter: Payer: Self-pay | Admitting: Family Medicine

## 2021-10-09 VITALS — BP 128/78 | HR 57 | Resp 16 | Ht 66.5 in | Wt 170.0 lb

## 2021-10-09 DIAGNOSIS — Z0000 Encounter for general adult medical examination without abnormal findings: Secondary | ICD-10-CM | POA: Diagnosis not present

## 2021-10-09 DIAGNOSIS — E039 Hypothyroidism, unspecified: Secondary | ICD-10-CM | POA: Diagnosis not present

## 2021-10-09 DIAGNOSIS — Z124 Encounter for screening for malignant neoplasm of cervix: Secondary | ICD-10-CM | POA: Diagnosis not present

## 2021-10-09 DIAGNOSIS — Z1231 Encounter for screening mammogram for malignant neoplasm of breast: Secondary | ICD-10-CM

## 2021-10-09 DIAGNOSIS — Z23 Encounter for immunization: Secondary | ICD-10-CM | POA: Diagnosis not present

## 2021-10-09 DIAGNOSIS — E663 Overweight: Secondary | ICD-10-CM | POA: Diagnosis not present

## 2021-10-09 NOTE — Progress Notes (Signed)
Complete physical exam   Patient: Yolanda Fox   DOB: [birth year]   62 y.o. Female  MRN: [id number] Visit Date: 10/09/2021  Today's healthcare provider: Lavon Paganini, MD   Chief Complaint  Patient presents with   Annual Exam   Subjective    Yolanda Fox is a 61 y.o. female who presents today for a complete physical exam.  She reports consuming a general diet. Gym/ health club routine includes water aerobics 2-3 times a week, walking, gardening. She generally feels well. She reports sleeping well. She does not have additional problems to discuss today.   HPI  Had pulmonary issues last year, saw pulmonologist for lung nodules which resolved Quit drinking diet coke 1 year ago Home blood pressure usually 120s/80, but had elevated blood pressure when giving blood in November  Past Medical History:  Diagnosis Date   Acute pharyngitis    Benign neoplasm of colon    Bicornuate uterus    Degeneration of intervertebral disc, site unspecified    Esophageal reflux    External thrombosed hemorrhoids    Hematuria, unspecified    Nontoxic uninodular goiter    Osteoarthrosis, unspecified whether generalized or localized, unspecified site    Pulmonary nodules/lesions, multiple 09/21/2020   Thyroid nodule 03/01/2015   Left; s/p right thyroidectomy   Unspecified adjustment reaction    Unspecified hypothyroidism    Unspecified vitamin D deficiency    Past Surgical History:  Procedure Laterality Date   THYROIDECTOMY, PARTIAL  1991   TONSILLECTOMY  1985   Social History   Socioeconomic History   Marital status: Married    Spouse name: Not on file   Number of children: Not on file   Years of education: Not on file   Highest education level: Not on file  Occupational History   Not on file  Tobacco Use   Smoking status: Never   Smokeless tobacco: Never  Vaping Use   Vaping Use: Never used  Substance and Sexual Activity   Alcohol use: Yes    Alcohol/week: 0.0 standard  drinks    Comment: occassionally   Drug use: No   Sexual activity: Not on file  Other Topics Concern   Not on file  Social History Narrative   Not on file   Social Determinants of Health   Financial Resource Strain: Not on file  Food Insecurity: Not on file  Transportation Needs: Not on file  Physical Activity: Not on file  Stress: Not on file  Social Connections: Not on file  Intimate Partner Violence: Not on file   Family Status  Relation Name Status   Mother  Deceased       rheumatoid arthritis, peptic ulcer, gastroesophageal reflux disease, emphysema, hiatal hernia, gallbladder disease, colon polyps   Father  Deceased       allergies, hypertension, arthritis, coronary artery disease, renal disease, alcoholism, colon polyps   Sister  Alive       back surgery   Brother  Alive       back problems   Brother  Alive       diabetes mellitus type II, Hypertension,back problems   Sister  Deceased       mva   Neg Hx  (Not Specified)   Family History  Problem Relation Age of Onset   Heart disease Father    Breast cancer Neg Hx    Allergies  Allergen Reactions   Codeine    Penicillins     Patient  Care Team: Gwyneth Sprout, FNP as PCP - General (Family Medicine)   Medications: Outpatient Medications Prior to Visit  Medication Sig   aspirin 81 MG tablet Take by mouth 3 (three) times a week.    cholecalciferol (VITAMIN D3) 25 MCG (1000 UNIT) tablet    fluticasone (FLONASE) 50 MCG/ACT nasal spray Place into both nostrils at bedtime as needed for allergies or rhinitis.   Ivermectin (SOOLANTRA) 1 % CREA Apply 1 application topically at bedtime.   levothyroxine (SYNTHROID) 100 MCG tablet Take 1 tablet (100 mcg total) by mouth daily before breakfast.   loratadine (CLARITIN) 10 MG tablet Take 10 mg by mouth daily.   Multiple Vitamin (MULTIVITAMIN) capsule Take 1 capsule by mouth daily.   famotidine (PEPCID) 20 MG tablet Take by mouth.   [DISCONTINUED] albuterol (VENTOLIN  HFA) 108 (90 Base) MCG/ACT inhaler Inhale 1-2 puffs into the lungs every 6 (six) hours as needed for wheezing or shortness of breath.   [DISCONTINUED] MegaRed Omega-3 Krill Oil 350 MG CAPS    [DISCONTINUED] montelukast (SINGULAIR) 10 MG tablet Take 1 tablet (10 mg total) by mouth at bedtime. (Patient not taking: Reported on 11/24/2020)   No facility-administered medications prior to visit.    Review of Systems  HENT:  Positive for tinnitus.   Respiratory:  Positive for shortness of breath.   Endocrine: Positive for cold intolerance.  Genitourinary:  Positive for urgency.  Musculoskeletal:  Positive for gait problem.  Allergic/Immunologic: Positive for environmental allergies.  Psychiatric/Behavioral:  Positive for decreased concentration.   All other systems reviewed and are negative.    Objective    BP 128/78 (BP Location: Left Arm, Patient Position: Sitting, Cuff Size: Normal)    Pulse (!) 57    Resp 16    Ht 5' 6.5" (1.689 m)    Wt 170 lb (77.1 kg)    BMI 27.03 kg/m    Physical Exam Vitals reviewed.  Constitutional:      General: She is not in acute distress.    Appearance: Normal appearance. She is not ill-appearing or toxic-appearing.  HENT:     Head: Normocephalic and atraumatic.     Right Ear: External ear normal.     Left Ear: External ear normal.     Nose: Nose normal. No congestion or rhinorrhea.     Mouth/Throat:     Mouth: Mucous membranes are moist.     Pharynx: Oropharynx is clear. No oropharyngeal exudate or posterior oropharyngeal erythema.  Eyes:     General: No scleral icterus.    Extraocular Movements: Extraocular movements intact.     Conjunctiva/sclera: Conjunctivae normal.     Pupils: Pupils are equal, round, and reactive to light.  Cardiovascular:     Rate and Rhythm: Normal rate and regular rhythm.     Pulses: Normal pulses.     Heart sounds: Normal heart sounds. No murmur heard.   No friction rub. No gallop.  Pulmonary:     Effort: Pulmonary  effort is normal. No respiratory distress.     Breath sounds: Normal breath sounds. No wheezing or rhonchi.  Chest:     Chest wall: No tenderness.  Abdominal:     General: Abdomen is flat. There is no distension.     Palpations: Abdomen is soft.     Tenderness: There is no abdominal tenderness.  Genitourinary:    General: Normal vulva.     Vagina: No vaginal discharge.  Musculoskeletal:        General: Normal range  of motion.     Cervical back: Normal range of motion and neck supple.     Right lower leg: No edema.     Left lower leg: No edema.  Skin:    General: Skin is warm and dry.     Capillary Refill: Capillary refill takes less than 2 seconds.     Findings: No lesion or rash.  Neurological:     General: No focal deficit present.     Mental Status: She is alert and oriented to person, place, and time. Mental status is at baseline.  Psychiatric:        Mood and Affect: Mood normal.        Behavior: Behavior normal.        Thought Content: Thought content normal.        Judgment: Judgment normal.     Last depression screening scores PHQ 2/9 Scores 04/27/2020 04/19/2019 03/13/2018  PHQ - 2 Score 0 2 0  PHQ- 9 Score - 4 2   Last fall risk screening Fall Risk  04/27/2020  Falls in the past year? 1  Number falls in past yr: 0  Injury with Fall? 1   Last Audit-C alcohol use screening Alcohol Use Disorder Test (AUDIT) 04/19/2019  1. How often do you have a drink containing alcohol? 2  2. How many drinks containing alcohol do you have on a typical day when you are drinking? 0  3. How often do you have six or more drinks on one occasion? 0  AUDIT-C Score 2   A score of 3 or more in women, and 4 or more in men indicates increased risk for alcohol abuse, EXCEPT if all of the points are from question 1   No results found for any visits on 10/09/21.  Assessment & Plan    Routine Health Maintenance and Physical Exam  Exercise Activities and Dietary recommendations  Goals    None     Immunization History  Administered Date(s) Administered   Influenza,inj,Quad PF,6+ Mos 07/06/2019   Influenza-Unspecified 07/15/2017, 06/26/2018, 06/14/2020, 06/19/2021   PFIZER Comirnaty(Gray Top)Covid-19 Tri-Sucrose Vaccine 11/23/2019, 12/14/2019, 08/15/2020   Pfizer Covid-19 Vaccine Bivalent Booster 48yrs & up 08/14/2021   Tdap 01/30/2007, 04/27/2020   Zoster Recombinat (Shingrix) 07/18/2021, 10/09/2021    Health Maintenance  Topic Date Due   MAMMOGRAM  07/01/2021   PAP SMEAR-Modifier  03/14/2023   COLONOSCOPY (Pts 45-66yrs Insurance coverage will need to be confirmed)  06/03/2023   TETANUS/TDAP  04/27/2030   INFLUENZA VACCINE  Completed   COVID-19 Vaccine  Completed   Hepatitis C Screening  Completed   HIV Screening  Completed   Zoster Vaccines- Shingrix  Completed   HPV VACCINES  Aged Out    Discussed health benefits of physical activity, and encouraged her to engage in regular exercise appropriate for her age and condition.  Problem List Items Addressed This Visit       Endocrine   Adult hypothyroidism    Chronic, previously well controlled S/p thyroidectomy Recheck TSH and adjust Synthroid accordingly      Relevant Orders   TSH     Other   Overweight    Discussed importance of healthy weight management Discussed diet and exercise Patient has made dietary changes and stopped drinking diet coke 1 year ago      Relevant Orders   Comprehensive metabolic panel   Lipid panel   Other Visit Diagnoses     Encounter for annual physical exam    -  Primary   Relevant Orders   Comprehensive metabolic panel   Lipid panel   TSH   HgB A1c   Screening mammogram for breast cancer       Relevant Orders   MM 3D SCREEN BREAST BILATERAL   Screening for cervical cancer       Relevant Orders   Cytology - PAP        Return in about 1 year (around 10/09/2022) for CPE, With new PCP.     Nelva Nay, Medical Student 10/09/2021, 11:30 AM   Patient  seen along with MS3 student Nelva Nay. I personally evaluated this patient along with the student, and verified all aspects of the history, physical exam, and medical decision making as documented by the student. I agree with the student's documentation and have made all necessary edits.  Kadarious Dikes, Dionne Bucy, MD, MPH [facility]

## 2021-10-09 NOTE — Assessment & Plan Note (Signed)
Chronic, previously well controlled S/p thyroidectomy Recheck TSH and adjust Synthroid accordingly

## 2021-10-09 NOTE — Assessment & Plan Note (Signed)
Discussed importance of healthy weight management Discussed diet and exercise Patient has made dietary changes and stopped drinking diet coke 1 year ago

## 2021-10-10 LAB — CYTOLOGY - PAP
Comment: NEGATIVE
Diagnosis: NEGATIVE
High risk HPV: NEGATIVE

## 2021-10-12 DIAGNOSIS — Z0000 Encounter for general adult medical examination without abnormal findings: Secondary | ICD-10-CM | POA: Diagnosis not present

## 2021-10-12 DIAGNOSIS — E663 Overweight: Secondary | ICD-10-CM | POA: Diagnosis not present

## 2021-10-12 DIAGNOSIS — E039 Hypothyroidism, unspecified: Secondary | ICD-10-CM | POA: Diagnosis not present

## 2021-10-13 LAB — COMPREHENSIVE METABOLIC PANEL
ALT: 14 IU/L (ref 0–32)
AST: 20 IU/L (ref 0–40)
Albumin/Globulin Ratio: 2 (ref 1.2–2.2)
Albumin: 4.2 g/dL (ref 3.8–4.8)
Alkaline Phosphatase: 73 IU/L (ref 44–121)
BUN/Creatinine Ratio: 14 (ref 12–28)
BUN: 12 mg/dL (ref 8–27)
Bilirubin Total: <0.2 mg/dL (ref 0.0–1.2)
CO2: 25 mmol/L (ref 20–29)
Calcium: 9.6 mg/dL (ref 8.7–10.3)
Chloride: 103 mmol/L (ref 96–106)
Creatinine, Ser: 0.85 mg/dL (ref 0.57–1.00)
Globulin, Total: 2.1 g/dL (ref 1.5–4.5)
Glucose: 96 mg/dL (ref 70–99)
Potassium: 4.9 mmol/L (ref 3.5–5.2)
Sodium: 142 mmol/L (ref 134–144)
Total Protein: 6.3 g/dL (ref 6.0–8.5)
eGFR: 77 mL/min/{1.73_m2} (ref >59)

## 2021-10-13 LAB — LIPID PANEL
Chol/HDL Ratio: 2.9 ratio (ref 0.0–4.4)
Cholesterol, Total: 155 mg/dL (ref 100–199)
HDL: 53 mg/dL (ref >39)
LDL Chol Calc (NIH): 83 mg/dL (ref 0–99)
Triglycerides: 105 mg/dL (ref 0–149)
VLDL Cholesterol Cal: 19 mg/dL (ref 5–40)

## 2021-10-13 LAB — HEMOGLOBIN A1C
Est. average glucose Bld gHb Est-mCnc: 117 mg/dL
Hgb A1c MFr Bld: 5.7 % — ABNORMAL HIGH (ref 4.8–5.6)

## 2021-10-13 LAB — TSH: TSH: 3.05 u[IU]/mL (ref 0.450–4.500)

## 2021-10-24 ENCOUNTER — Encounter: Payer: Self-pay | Admitting: Family Medicine

## 2021-10-24 DIAGNOSIS — E039 Hypothyroidism, unspecified: Secondary | ICD-10-CM

## 2021-10-25 MED ORDER — LEVOTHYROXINE SODIUM 100 MCG PO TABS: 100.0000 ug | ORAL_TABLET | Freq: Every day | ORAL | 1 refills | Status: DC

## 2021-11-06 ENCOUNTER — Telehealth: Payer: Self-pay

## 2021-11-06 NOTE — Progress Notes (Unsigned)
° °  Ar[place] Ponder Sunny Aguon,acting as a scribe for Gwyneth Sprout, FNP.,have documented all relevant documentation on the behalf of Gwyneth Sprout, FNP,as directed by  Gwyneth Sprout, FNP while in the presence of Gwyneth Sprout, FNP.     Established patient visit   Patient: Yolanda Fox   DOB: 1960/11/21   61 y.o. Female  MRN: [id number] Visit Date: 11/07/2021  Today's healthcare provider: Gwyneth Sprout, FNP   No chief complaint on file.  Subjective    HPI  Hypertension, follow-up  BP Readings from Last 3 Encounters:  10/09/21 128/78  11/24/20 118/72  10/17/20 118/74   Wt Readings from Last 3 Encounters:  10/09/21 170 lb (77.1 kg)  11/24/20 169 lb 12.8 oz (77 kg)  10/17/20 169 lb (76.7 kg)     She was last seen for hypertension 1 months ago.  BP at that visit was as above. Management since that visit includes asking patient to keep a record of her readings.  She is currently not on medication for blood pressure. She is following a {diet:21022986} diet. She {is/is not:9024} exercising. She {does/does not:200015} smoke.  Use of agents associated with hypertension: {bp agents assoc with hypertension:511::"none"}.   Outside blood pressures are {***enter patient reported home BP readings, or 'not being checked':1}. Symptoms: {Yes/No:20286} chest pain {Yes/No:20286} chest pressure  {Yes/No:20286} palpitations {Yes/No:20286} syncope  {Yes/No:20286} dyspnea {Yes/No:20286} orthopnea  {Yes/No:20286} paroxysmal nocturnal dyspnea {Yes/No:20286} lower extremity edema   Pertinent labs: Lab Results  Component Value Date   CHOL 155 10/12/2021   HDL 53 10/12/2021   LDLCALC 83 10/12/2021   TRIG 105 10/12/2021   CHOLHDL 2.9 10/12/2021   Lab Results  Component Value Date   NA 142 10/12/2021   K 4.9 10/12/2021   CREATININE 0.85 10/12/2021   EGFR 77 10/12/2021   GLUCOSE 96 10/12/2021   TSH 3.050 10/12/2021     The 10-year ASCVD risk score (Arnett DK, et al., 2019) is: 3.5%    ---------------------------------------------------------------------------------------------------   Medications: Outpatient Medications Prior to Visit  Medication Sig   aspirin 81 MG tablet Take by mouth 3 (three) times a week.    cholecalciferol (VITAMIN D3) 25 MCG (1000 UNIT) tablet    famotidine (PEPCID) 20 MG tablet Take by mouth.   fluticasone (FLONASE) 50 MCG/ACT nasal spray Place into both nostrils at bedtime as needed for allergies or rhinitis.   Ivermectin (SOOLANTRA) 1 % CREA Apply 1 application topically at bedtime.   levothyroxine (SYNTHROID) 100 MCG tablet Take 1 tablet (100 mcg total) by mouth daily before breakfast.   loratadine (CLARITIN) 10 MG tablet Take 10 mg by mouth daily.   Multiple Vitamin (MULTIVITAMIN) capsule Take 1 capsule by mouth daily.   No facility-administered medications prior to visit.    Review of Systems  {Labs   Heme   Chem   Endocrine   Serology   Results Review (optional):23779}   Objective    There were no vitals taken for this visit. {Show previous vital signs (optional):23777}  Physical Exam  ***  No results found for any visits on 11/07/21.  Assessment & Plan     ***  No follow-ups on file.      {provider attestation***:1}   Gwyneth Sprout, [place] [phone] (phone) [phone] (fax)  [place]

## 2021-11-06 NOTE — Telephone Encounter (Signed)
Appt made

## 2021-11-06 NOTE — Telephone Encounter (Signed)
Copied from [place] [phone]. Topic: General - Other >> Nov 06, 2021  8:50 AM Tessa Lerner A wrote: Reason for CRM: The patient would like to speak with a member of clinical staff about what direction to take next regarding their blood pressure   The patient shares that they were directed to follow up on their blood pressure after a few weeks but they remain uncertain of who to follow up with   Please contact further when available

## 2021-11-07 ENCOUNTER — Inpatient Hospital Stay (INDEPENDENT_AMBULATORY_CARE_PROVIDER_SITE_OTHER): Payer: BC Managed Care – PPO

## 2021-11-07 ENCOUNTER — Ambulatory Visit: Payer: BC Managed Care – PPO | Admitting: Family Medicine

## 2021-11-07 ENCOUNTER — Encounter: Payer: Self-pay | Admitting: Family Medicine

## 2021-11-07 ENCOUNTER — Other Ambulatory Visit: Payer: Self-pay

## 2021-11-07 VITALS — BP 104/74 | HR 87 | Temp 98.5°F | Resp 16 | Wt 167.8 lb

## 2021-11-07 DIAGNOSIS — R5383 Other fatigue: Secondary | ICD-10-CM | POA: Insufficient documentation

## 2021-11-07 DIAGNOSIS — R0989 Other specified symptoms and signs involving the circulatory and respiratory systems: Secondary | ICD-10-CM | POA: Insufficient documentation

## 2021-11-07 DIAGNOSIS — R0602 Shortness of breath: Secondary | ICD-10-CM | POA: Diagnosis not present

## 2021-11-07 DIAGNOSIS — R911 Solitary pulmonary nodule: Secondary | ICD-10-CM

## 2021-11-07 NOTE — Assessment & Plan Note (Signed)
Hx of ground glass- referred for pulm, nodule "disappeared" Completed PFTs Has since stopped Singulair  No f/u was recommended Diminished air movement noted

## 2021-11-07 NOTE — Assessment & Plan Note (Signed)
Office Bps stable Home Bps reviewed- elevated; consistent with BP when pt was giving blood Pt reported that 'she drinks water like it's her job' She has been active as Oceanographer Recommend zio patch and referral to cardiology for both functionality and electrical impulse fu of the heart

## 2021-11-07 NOTE — Assessment & Plan Note (Signed)
Generalized ill feelings, wax/wane Has felt off

## 2021-11-07 NOTE — Assessment & Plan Note (Signed)
Vague complaints of SOB >10 years Previous dx with asthma- was on Singulair Has recently stopped PFTs completed Passive smoking exposure as a child

## 2021-11-07 NOTE — Progress Notes (Signed)
Established patient visit   Patient: Yolanda Fox   DOB: 08-Nov-1960   61 y.o. Female  MRN: [id number] Visit Date: 11/07/2021  Today's healthcare provider: Gwyneth Sprout, FNP   Chief Complaint  Patient presents with   Follow-up   Subjective    HPI  Follow up for blood pressure  The patient was last seen for this 1 months ago. Changes made at last visit include no changes, patient to continue monitoring blood pressure at home. Patient reported that in December 2022 she had on elevated blood pressure reading while donating blood. 158/71, 164/75, 148/73, 166/78, 141/75 Water aerobic 3 times a week and walking daily. Patient does try to follow low sodium diet.   BP Readings from Last 3 Encounters:  11/07/21 104/74  10/09/21 128/78  11/24/20 118/72    -----------------------------------------------------------------------------------------   Medications: Outpatient Medications Prior to Visit  Medication Sig   aspirin 81 MG tablet Take by mouth 3 (three) times a week.    cholecalciferol (VITAMIN D3) 25 MCG (1000 UNIT) tablet    famotidine (PEPCID) 20 MG tablet Take by mouth.   fluticasone (FLONASE) 50 MCG/ACT nasal spray Place into both nostrils at bedtime as needed for allergies or rhinitis.   Ivermectin (SOOLANTRA) 1 % CREA Apply 1 application topically at bedtime.   levothyroxine (SYNTHROID) 100 MCG tablet Take 1 tablet (100 mcg total) by mouth daily before breakfast.   loratadine (CLARITIN) 10 MG tablet Take 10 mg by mouth daily.   Multiple Vitamin (MULTIVITAMIN) capsule Take 1 capsule by mouth daily.   No facility-administered medications prior to visit.    Review of Systems  Constitutional:  Negative for appetite change and fatigue.  Eyes:  Negative for visual disturbance.  Respiratory:  Positive for shortness of breath. Negative for chest tightness.   Cardiovascular:  Negative for chest pain and palpitations.  Neurological:  Negative for headaches.       Objective    BP 104/74 (BP Location: Left Arm, Patient Position: Sitting, Cuff Size: Large)    Pulse 87    Temp 98.5 F (36.9 C) (Temporal)    Resp 16    Wt 167 lb 12.8 oz (76.1 kg)    SpO2 97%    BMI 26.68 kg/m    Physical Exam Vitals and nursing note reviewed.  Constitutional:      General: She is not in acute distress.    Appearance: Normal appearance. She is overweight. She is not ill-appearing, toxic-appearing or diaphoretic.  HENT:     Head: Normocephalic and atraumatic.  Cardiovascular:     Rate and Rhythm: Normal rate and regular rhythm.     Pulses: Normal pulses.     Heart sounds: Normal heart sounds. No murmur heard.   No friction rub. No gallop.  Pulmonary:     Effort: Pulmonary effort is normal. No respiratory distress.     Breath sounds: Normal breath sounds. Decreased air movement present. No stridor. No wheezing, rhonchi or rales.  Chest:     Chest wall: No tenderness.  Abdominal:     General: Bowel sounds are normal.     Palpations: Abdomen is soft.  Musculoskeletal:        General: No swelling, tenderness, deformity or signs of injury. Normal range of motion.     Right lower leg: No edema.     Left lower leg: No edema.  Skin:    General: Skin is warm and dry.     Capillary Refill: Capillary  refill takes less than 2 seconds.     Coloration: Skin is not jaundiced or pale.     Findings: No bruising, erythema, lesion or rash.  Neurological:     General: No focal deficit present.     Mental Status: She is alert and oriented to person, place, and time. Mental status is at baseline.     Cranial Nerves: No cranial nerve deficit.     Sensory: No sensory deficit.     Motor: No weakness.     Coordination: Coordination normal.  Psychiatric:        Mood and Affect: Mood normal.        Behavior: Behavior normal.        Thought Content: Thought content normal.        Judgment: Judgment normal.     No results found for any visits on 11/07/21.  Assessment & Plan      Problem List Items Addressed This Visit       Other   Labile blood pressure    Office Bps stable Home Bps reviewed- elevated; consistent with BP when pt was giving blood Pt reported that 'she drinks water like it's her job' She has been active as Oceanographer Recommend zio patch and referral to cardiology for both functionality and electrical impulse fu of the heart      Relevant Orders   LONG TERM MONITOR (3-14 DAYS)   Ambulatory referral to Cardiology   Lung nodule    Hx of ground glass- referred for pulm, nodule "disappeared" Completed PFTs Has since stopped Singulair  No f/u was recommended Diminished air movement noted      Other fatigue    Generalized ill feelings, wax/wane Has felt off       Relevant Orders   LONG TERM MONITOR (3-14 DAYS)   Ambulatory referral to Cardiology   Shortness of breath - Primary    Vague complaints of SOB >10 years Previous dx with asthma- was on Singulair Has recently stopped PFTs completed Passive smoking exposure as a child      Relevant Orders   LONG TERM MONITOR (3-14 DAYS)   Ambulatory referral to Cardiology     Return if symptoms worsen or fail to improve.      Vonna Kotyk, FNP, have reviewed all documentation for this visit. The documentation on 11/07/21 for the exam, diagnosis, procedures, and orders are all accurate and complete.    Gwyneth Sprout, [place] [phone] (phone) [phone] (fax)  [place]

## 2021-11-12 DIAGNOSIS — R0602 Shortness of breath: Secondary | ICD-10-CM | POA: Diagnosis not present

## 2021-11-13 ENCOUNTER — Encounter: Payer: Self-pay | Admitting: Family Medicine

## 2021-11-15 ENCOUNTER — Encounter: Payer: Self-pay | Admitting: Family Medicine

## 2021-12-03 DIAGNOSIS — R5383 Other fatigue: Secondary | ICD-10-CM | POA: Diagnosis not present

## 2021-12-03 DIAGNOSIS — R0989 Other specified symptoms and signs involving the circulatory and respiratory systems: Secondary | ICD-10-CM | POA: Diagnosis not present

## 2021-12-03 DIAGNOSIS — R0602 Shortness of breath: Secondary | ICD-10-CM | POA: Diagnosis not present

## 2021-12-19 ENCOUNTER — Ambulatory Visit
Admission: RE | Admit: 2021-12-19 | Discharge: 2021-12-19 | Disposition: A | Discharge status: Home / Self Care | Payer: BC Managed Care – PPO | Source: Ambulatory Visit | Attending: Family Medicine | Admitting: Family Medicine

## 2021-12-19 DIAGNOSIS — Z1231 Encounter for screening mammogram for malignant neoplasm of breast: Secondary | ICD-10-CM | POA: Diagnosis not present

## 2021-12-21 ENCOUNTER — Ambulatory Visit: Payer: BC Managed Care – PPO | Admitting: Cardiovascular Disease

## 2021-12-24 ENCOUNTER — Encounter: Payer: BC Managed Care – PPO | Admitting: Dermatology

## 2022-01-01 ENCOUNTER — Ambulatory Visit: Payer: BC Managed Care – PPO | Admitting: Dermatology

## 2022-01-01 DIAGNOSIS — L719 Rosacea, unspecified: Secondary | ICD-10-CM | POA: Diagnosis not present

## 2022-01-01 DIAGNOSIS — L578 Other skin changes due to chronic exposure to nonionizing radiation: Secondary | ICD-10-CM | POA: Diagnosis not present

## 2022-01-01 DIAGNOSIS — D229 Melanocytic nevi, unspecified: Secondary | ICD-10-CM

## 2022-01-01 DIAGNOSIS — D225 Melanocytic nevi of trunk: Secondary | ICD-10-CM

## 2022-01-01 DIAGNOSIS — L814 Other melanin hyperpigmentation: Secondary | ICD-10-CM | POA: Diagnosis not present

## 2022-01-01 DIAGNOSIS — L820 Inflamed seborrheic keratosis: Secondary | ICD-10-CM | POA: Diagnosis not present

## 2022-01-01 DIAGNOSIS — D2362 Other benign neoplasm of skin of left upper limb, including shoulder: Secondary | ICD-10-CM

## 2022-01-01 DIAGNOSIS — L821 Other seborrheic keratosis: Secondary | ICD-10-CM

## 2022-01-01 DIAGNOSIS — D2271 Melanocytic nevi of right lower limb, including hip: Secondary | ICD-10-CM

## 2022-01-01 DIAGNOSIS — D2372 Other benign neoplasm of skin of left lower limb, including hip: Secondary | ICD-10-CM

## 2022-01-01 DIAGNOSIS — Z1283 Encounter for screening for malignant neoplasm of skin: Secondary | ICD-10-CM

## 2022-01-01 DIAGNOSIS — D2272 Melanocytic nevi of left lower limb, including hip: Secondary | ICD-10-CM

## 2022-01-01 MED ORDER — IVERMECTIN 1 % EX CREA
1.0000 | TOPICAL_CREAM | Freq: Every evening | CUTANEOUS | 5 refills | Status: DC
Start: 2022-01-01 — End: 2023-01-06

## 2022-01-01 NOTE — Progress Notes (Signed)
? ?Follow-Up Visit ?  ?Subjective  ?Yolanda Fox is a 61 y.o. female who presents for the following: Annual Exam. ? ?The patient presents for Total-Body Skin Exam (TBSE) for skin cancer screening and mole check.  The patient has spots, moles and lesions to be evaluated, some may be new or changing. She has a few growths on her back that get rubbed by bathing suit, she would like removed. She has Rosacea and uses Soolantra Cream.  ?  ?The following portions of the chart were reviewed this encounter and updated as appropriate:  ?  ?  ? ?Review of Systems:  No other skin or systemic complaints except as noted in HPI or Assessment and Plan. ? ?Objective  ?Well appearing patient in no apparent distress; mood and affect are within normal limits. ? ?A full examination was performed including scalp, head, eyes, ears, nose, lips, neck, chest, axillae, abdomen, back, buttocks, bilateral upper extremities, bilateral lower extremities, hands, feet, fingers, toes, fingernails, and toenails. All findings within normal limits unless otherwise noted below. ? ?trunk, legs ?Left Lower Back: ?2.19m med dark brown macule ?  ?Left Medial Thigh: ?2.537mmed dark brown macule ?  ?Left lateral breast: ?7.0 x 6.50m7mpeckled brown macule ?  ?Right lower flank: ?7.0 x 4.50mm59mown pap ?  ?Right Popliteal Fossa: ?3.50mm 67mwn thin pap 2 tone ? ?Right lateral breast: ?2.0 mm med dark brown macules x 2 ?  ? ?Right mid upper back x 2, Left upper chest x 1, L spinal lower back x 1 (4) ?Erythematous stuck-on, waxy papule or plaque ? ?face ?Mild erythema of the malar cheeks and nose. ? ? ? ?Assessment & Plan  ?Skin cancer screening performed today. ? ?Actinic Damage ?- chronic, secondary to cumulative UV radiation exposure/sun exposure over time ?- diffuse scaly erythematous macules with underlying dyspigmentation ?- Recommend daily broad spectrum sunscreen SPF 30+ to sun-exposed areas, reapply every 2 hours as needed.  ?- Recommend staying in the  shade or wearing long sleeves, sun glasses (UVA+UVB protection) and wide brim hats (4-inch brim around the entire circumference of the hat). ?- Call for new or changing lesions. ? ?Lentigines ?- Scattered tan macules ?- Due to sun exposure ?- Benign-appering, observe ?- Recommend daily broad spectrum sunscreen SPF 30+ to sun-exposed areas, reapply every 2 hours as needed. ?- Call for any changes ? ?Seborrheic Keratoses ?- Stuck-on, waxy, tan-brown papules and/or plaques  ?- Benign-appearing ?- Discussed benign etiology and prognosis. ?- Observe ?- Call for any changes ? ?Dermatofibroma ?- Firm pink/brown papulenodule with dimple sign of the left posterior shoulder, left medial calf ?- Benign appearing ?- Call for any changes ? ?Melanocytic Nevi ?- Tan-brown and/or pink-flesh-colored symmetric macules and papules ?- Benign appearing on exam today ?- Observation ?- Call clinic for new or changing moles ?- Recommend daily use of broad spectrum spf 30+ sunscreen to sun-exposed areas.  ? ?Hemangiomas ?- Red papules ?- Discussed benign nature ?- Observe ?- Call for any changes ? ?Nevus ?trunk, legs ? ?Benign-appearing.  Stable. Observation.  Call clinic for new or changing moles.  Recommend daily use of broad spectrum spf 30+ sunscreen to sun-exposed areas.  ? ?Inflamed seborrheic keratosis (4) ?Right mid upper back x 2, Left upper chest x 1, L spinal lower back x 1 ? ?Destruction of lesion - Right mid upper back x 2, Left upper chest x 1, L spinal lower back x 1 ? ?Destruction method: cryotherapy   ?Informed consent: discussed and consent obtained   ?  Lesion destroyed using liquid nitrogen: Yes   ?Region frozen until ice ball extended beyond lesion: Yes   ?Outcome: patient tolerated procedure well with no complications   ?Post-procedure details: wound care instructions given   ?Additional details:  Prior to procedure, discussed risks of blister formation, small wound, skin dyspigmentation, or rare scar following  cryotherapy. Recommend Vaseline ointment to treated areas while healing. ? ? ?Rosacea ?face ? ?Chronic condition with duration or expected duration over one year. Currently well-controlled. ? ?Rosacea is a chronic progressive skin condition usually affecting the face of adults, causing redness and/or acne bumps. It is treatable but not curable. It sometimes affects the eyes (ocular rosacea) as well. It may respond to topical and/or systemic medication and can flare with stress, sun exposure, alcohol, exercise and some foods.  Daily application of broad spectrum spf 30+ sunscreen to face is recommended to reduce flares. ? ? Continue Soolantra qhs to face 5Rf. ? ?Related Medications ?Ivermectin (SOOLANTRA) 1 % CREA ?Apply 1 application. topically at bedtime. ? ? ?Return in about 1 year (around 01/02/2023) for TBSE. ? ?I, Jamesetta Orleans, CMA, am acting as scribe for Brendolyn Patty, MD . ? ?Documentation: I have reviewed the above documentation for accuracy and completeness, and I agree with the above. ? ?Brendolyn Patty MD  ? ?

## 2022-01-01 NOTE — Patient Instructions (Addendum)
Cryotherapy Aftercare ? ?Wash gently with soap and water everyday.   ?Apply Vaseline and Band-Aid daily until healed.  ? ?Seborrheic Keratosis ? ?What causes seborrheic keratoses? ?Seborrheic keratoses are harmless, common skin growths that first appear during adult life.  As time goes by, more growths appear.  Some people may develop a large number of them.  Seborrheic keratoses appear on both covered and uncovered body parts.  They are not caused by sunlight.  The tendency to develop seborrheic keratoses can be inherited.  They vary in color from skin-colored to gray, brown, or even black.  They can be either smooth or have a rough, warty surface.   ?Seborrheic keratoses are superficial and look as if they were stuck on the skin.  Under the microscope this type of keratosis looks like layers upon layers of skin.  That is why at times the top layer may seem to fall off, but the rest of the growth remains and re-grows.   ? ?Treatment ?Seborrheic keratoses do not need to be treated, but can easily be removed in the office.  Seborrheic keratoses often cause symptoms when they rub on clothing or jewelry.  Lesions can be in the way of shaving.  If they become inflamed, they can cause itching, soreness, or burning.  Removal of a seborrheic keratosis can be accomplished by freezing, burning, or surgery. ?If any spot bleeds, scabs, or grows rapidly, please return to have it checked, as these can be an indication of a skin cancer. ? ? ?Rosacea ? ?What is rosacea? ?Rosacea (say: ro-zay-sha) is a common skin disease that usually begins as a trend of flushing or blushing easily.  As rosacea progresses, a persistent redness in the center of the face will develop and may gradually spread beyond the nose and cheeks to the forehead and chin.  In some cases, the ears, chest, and back could be affected.  Rosacea may appear as tiny blood vessels or small red bumps that occur in crops.  Frequently they can contain pus, and are called  ?pustules?.  If the bumps do not contain pus, they are referred to as ?papules?.  Rarely, in prolonged, untreated cases of rosacea, the oil glands of the nose and cheeks may become permanently enlarged.  This is called rhinophyma, and is seen more frequently in men. ? ?Signs and Risks ?In its beginning stages, rosacea tends to come and go, which makes it difficult to recognize.  It can start as intermittent flushing of the face.  Eventually, blood vessels may become permanently visible.  Pustules and papules can appear, but can be mistaken for adult acne.  People of all races, ages, genders and ethnic groups are at risk of developing rosacea.  However, it is more common in women (especially around menopause) and adults with fair skin between the ages of 73 and 56. ? ?Treatment ?Dermatologists typically recommend a combination of treatments to effectively manage rosacea.  Treatment can improve symptoms and may stop the progression of the rosacea.  Treatment may involve both topical and oral medications.  The tetracycline antibiotics are often used for their anti-inflammatory effect; however, because of the possibility of developing antibiotic resistance, they should not be used long term at full dose.  For dilated blood vessels the options include electrodessication (uses electric current through a small needle), laser treatment, and cosmetics to hide the redness.   ?With all forms of treatment, improvement is a slow process, and patients may not see any results for the first 3-4  weeks.  It is very important to avoid the sun and other triggers.  Patients must wear sunscreen daily. ? ?Skin Care Instructions: ?Cleanse the skin with a mild soap such as CeraVe cleanser, Cetaphil cleanser, or Dove soap once or twice daily as needed. ?Moisturize with Eucerin Redness Relief Daily Perfecting Lotion (has a subtle green tint), CeraVe Moisturizing Cream, or Oil of Olay Daily Moisturizer with sunscreen every morning and/or night  as recommended. ?Makeup should be ?non-comedogenic? (won?t clog pores) and be labeled ?for sensitive skin?Kermit Balo choices for cosmetics are: Neutrogena, Almay, and Physician?s Formula.  Any product with a green tint tends to offset a red complexion. ?If your eyes are dry and irritated, use artificial tears 2-3 times per day and cleanse the eyelids daily with baby shampoo.  Have your eyes examined at least every 2 years.  Be sure to tell your eye doctor that you have rosacea. ?Alcoholic beverages tend to cause flushing of the skin, and may make rosacea worse. ?Always wear sunscreen, protect your skin from extreme hot and cold temperatures, and avoid spicy foods, hot drinks, and mechanical irritation such as rubbing, scrubbing, or massaging the face.  Avoid harsh skin cleansers, cleansing masks, astringents, and exfoliation. If a particular product burns or makes your face feel tight, then it is likely to flare your rosacea. ?If you are having difficulty finding a sunscreen that you can tolerate, you may try switching to a chemical-free sunscreen.  These are ones whose active ingredient is zinc oxide or titanium dioxide only.  They should also be fragrance free, non-comedogenic, and labeled for sensitive skin. ?Rosacea triggers may vary from person to person.  There are a variety of foods that have been reported to trigger rosacea.  Some patients find that keeping a diary of what they were doing when they flared helps them avoid triggers. ? ? ?Melanoma ABCDEs ? ?Melanoma is the most dangerous type of skin cancer, and is the leading cause of death from skin disease.  You are more likely to develop melanoma if you: ?Have light-colored skin, light-colored eyes, or red or blond hair ?Spend a lot of time in the sun ?Tan regularly, either outdoors or in a tanning bed ?Have had blistering sunburns, especially during childhood ?Have a close family member who has had a melanoma ?Have atypical moles or large birthmarks ? ?Early  detection of melanoma is key since treatment is typically straightforward and cure rates are extremely high if we catch it early.  ? ?The first sign of melanoma is often a change in a mole or a new dark spot.  The ABCDE system is a way of remembering the signs of melanoma. ? ?A for asymmetry:  The two halves do not match. ?B for border:  The edges of the growth are irregular. ?C for color:  A mixture of colors are present instead of an even brown color. ?D for diameter:  Melanomas are usually (but not always) greater than 2m - the size of a pencil eraser. ?E for evolution:  The spot keeps changing in size, shape, and color. ? ?Please check your skin once per month between visits. You can use a small mirror in front and a large mirror behind you to keep an eye on the back side or your body.  ? ?If you see any new or changing lesions before your next follow-up, please call to schedule a visit. ? ?Please continue daily skin protection including broad spectrum sunscreen SPF 30+ to sun-exposed areas, reapplying every  2 hours as needed when you're outdoors.  ? ?Staying in the shade or wearing long sleeves, sun glasses (UVA+UVB protection) and wide brim hats (4-inch brim around the entire circumference of the hat) are also recommended for sun protection.   ? ? ?If You Need Anything After Your Visit ? ?If you have any questions or concerns for your doctor, please call our main line at [phone] and press option 4 to reach your doctor's medical assistant. If no one answers, please leave a voicemail as directed and we will return your call as soon as possible. Messages left after 4 pm will be answered the following business day.  ? ?You may also send [place] a message via MyChart. We typically respond to MyChart messages within 1-2 business days. ? ?For prescription refills, please ask your pharmacy to contact our office. Our fax number is [phone]. ? ?If you have an urgent issue when the clinic is closed that cannot wait  until the next business day, you can page your doctor at the number below.   ? ?Please note that while we do our best to be available for urgent issues outside of office hours, we are not available 2

## 2022-01-02 ENCOUNTER — Encounter: Payer: Self-pay | Admitting: Cardiology

## 2022-01-02 ENCOUNTER — Ambulatory Visit: Payer: BC Managed Care – PPO | Admitting: Cardiology

## 2022-01-02 VITALS — BP 130/80 | HR 50 | Ht 66.5 in | Wt 169.0 lb

## 2022-01-02 DIAGNOSIS — I471 Supraventricular tachycardia: Secondary | ICD-10-CM | POA: Diagnosis not present

## 2022-01-02 DIAGNOSIS — R0602 Shortness of breath: Secondary | ICD-10-CM

## 2022-01-02 DIAGNOSIS — I700 Atherosclerosis of aorta: Secondary | ICD-10-CM | POA: Diagnosis not present

## 2022-01-02 MED ORDER — METOPROLOL TARTRATE 25 MG PO TABS: 12.5000 mg | ORAL_TABLET | Freq: Two times a day (BID) | ORAL | 3 refills | Status: AC | PRN

## 2022-01-02 NOTE — Patient Instructions (Addendum)
Medications: ?Start metoprolol tartrate 12.5 mg (.5 tablet) two times daily as needed for heart palpitations.  ?Your physician recommends that you continue on your current medications as directed. Please refer to the Current Medication list given to you today. ?*If you need a refill on your cardiac medications before your next appointment, please call your pharmacy* ? ?Lab Work: ?None. ?If you have labs (blood work) drawn today and your tests are completely normal, you will receive your results only by: ?MyChart Message (if you have MyChart) OR ?A paper copy in the mail ?If you have any lab test that is abnormal or we need to change your treatment, we will call you to review the results. ? ?Testing/Procedures: ?None. ? ?Follow-Up: ?At [facility], you and your health needs are our priority.  As part of our continuing mission to provide you with exceptional heart care, we have created designated Provider Care Teams.  These Care Teams include your primary Cardiologist (physician) and Advanced Practice Providers (APPs -  Physician Assistants and Nurse Practitioners) who all work together to provide you with the care you need, when you need it. ? ?Your physician wants you to follow-up in: As needed with Lars Mage.  ? ?We recommend signing up for the patient portal called "MyChart".  Sign up information is provided on this After Visit Summary.  MyChart is used to connect with patients for Virtual Visits (Telemedicine).  Patients are able to view lab/test results, encounter notes, upcoming appointments, etc.  Non-urgent messages can be sent to your provider as well.   ?To learn more about what you can do with MyChart, go to [email or web address].   ? ?Any Other Special Instructions Will Be Listed Below (If Applicable). ? ?

## 2022-01-02 NOTE — Progress Notes (Signed)
?Electrophysiology Office Note:   ? ?Date:  01/02/2022  ? ?ID:  Yolanda Fox, DOB 07-07-1961, MRN [id number] ? ?PCP:  Gwyneth Sprout, FNP  ?[facility] Cardiologist:  None  ?[facility] Electrophysiologist:  Vickie Epley, MD  ? ?Referring MD: Gwyneth Sprout, FNP  ? ?Chief Complaint: Palpitations ? ?History of Present Illness:   ? ?Yolanda Fox is a 61 y.o. female who presents for an evaluation of palpitations at the request of Dr Brita Romp. Their medical history includes GERD, thyroid nodules. She was seen by Dr Brita Romp on 10/09/2021 for routine physical. She was then seen by Tally Joe on 11/07/2021 for shortness of breath. 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 Given complaints of SOB a heart monitor was ordered.  ? ?She tells me that she sometimes feels palpitations and shortness of breath when she exerts herself.  She is a very active woman but tells me that she has had to stop at times to catch her breath.  Sometimes with as little exertion is climbing 1 flight of stairs.  She also describes an episode while hiking with her husband where she had a sustained episode of fast heartbeats that caused significant shortness of breath.  She felt like her heart was going to "pound out of her chest". ? ?  ?Past Medical History:  ?Diagnosis Date  ? Acute pharyngitis   ? Benign neoplasm of colon   ? Bicornuate uterus   ? Degeneration of intervertebral disc, site unspecified   ? Esophageal reflux   ? External thrombosed hemorrhoids   ? Hematuria, unspecified   ? Nontoxic uninodular goiter   ? Osteoarthrosis, unspecified whether generalized or localized, unspecified site   ? Pulmonary nodules/lesions, multiple 09/21/2020  ? Thyroid nodule 03/01/2015  ? Left; s/p right thyroidectomy  ? Unspecified adjustment reaction   ? Unspecified hypothyroidism   ? Unspecified vitamin D deficiency   ? ? ?Past Surgical History:  ?Procedure Laterality Date  ? THYROIDECTOMY, PARTIAL  1991  ? TONSILLECTOMY  1985   ? ? ?Current Medications: ?Current Meds  ?Medication Sig  ? cholecalciferol (VITAMIN D3) 25 MCG (1000 UNIT) tablet every other day.  ? famotidine (PEPCID) 20 MG tablet Take by mouth.  ? fluticasone (FLONASE) 50 MCG/ACT nasal spray Place into both nostrils at bedtime as needed for allergies or rhinitis.  ? Ivermectin (SOOLANTRA) 1 % CREA Apply 1 application. topically at bedtime.  ? levothyroxine (SYNTHROID) 100 MCG tablet Take 1 tablet (100 mcg total) by mouth daily before breakfast.  ? loratadine (CLARITIN) 10 MG tablet Take 10 mg by mouth daily.  ? Multiple Vitamin (MULTIVITAMIN) capsule Take 1 capsule by mouth daily.  ? [DISCONTINUED] aspirin 81 MG tablet Take by mouth 3 (three) times a week.   ?  ? ?Allergies:   Codeine and Penicillins  ? ?Social History  ? ?Socioeconomic History  ? Marital status: Married  ?  Spouse name: Not on file  ? Number of children: Not on file  ? Years of education: Not on file  ? Highest education level: Not on file  ?Occupational History  ? Not on file  ?Tobacco Use  ? Smoking status: Never  ? Smokeless tobacco: Never  ?Vaping Use  ? Vaping Use: Never used  ?Substance and Sexual Activity  ? Alcohol use: Yes  ?  Alcohol/week: 0.0 standard drinks  ?  Comment: occassionally  ? Drug use: No  ? Sexual activity: Not on file  ?Other Topics Concern  ?  Not on file  ?Social History Narrative  ? Not on file  ? ?Social Determinants of Health  ? ?Financial Resource Strain: Not on file  ?Food Insecurity: Not on file  ?Transportation Needs: Not on file  ?Physical Activity: Not on file  ?Stress: Not on file  ?Social Connections: Not on file  ?  ? ?Family History: ?The patient's family history includes Heart disease in her father. There is no history of Breast cancer. ? ?ROS:   ?Please see the history of present illness.    ?All other systems reviewed and are negative. ? ?EKGs/Labs/Other Studies Reviewed:   ? ?The following studies were reviewed today: ? ?12/04/2021 [facility] personally reviewed ?HR 46 -  210 bpm, average 72 bpm. ?4 NSVT, longest lasting 5 beats. ?18 non-sustained SVT, longest lasting 13 seconds at a rate of 164 bpm. ?Symptom triggered events correspond to SVT. ?Rare supraventricular and ventricular ectopy. ?  ?11/20/2020 Cardiopulmonary exercise test ?Conclusion: Exercise testing with gas exchange demonstrates normal (actually excellent) functional capacity when compared to matched sedentary norms. There is no indication for cardiopulmonary limitation. Ventilatory threshold indicates effective training with 85% pred. There was a mild hypertensive response to exercise that could be contributing to regular exercise intolerance.  ? ? ? ? ? ? ? ?Recent Labs: ?10/12/2021: ALT 14; BUN 12; Creatinine, Ser 0.85; Potassium 4.9; Sodium 142; TSH 3.050  ?Recent Lipid Panel ?   ?Component Value Date/Time  ? CHOL 155 10/12/2021 0852  ? TRIG 105 10/12/2021 0852  ? HDL 53 10/12/2021 0852  ? CHOLHDL 2.9 10/12/2021 0852  ? [place] 83 10/12/2021 0852  ? ? ?Physical Exam:   ? ?VS:  BP 130/80 (BP Location: Left Arm, Patient Position: Sitting, Cuff Size: Normal)   Pulse (!) 50   Ht 5' 6.5" (1.689 m)   Wt 169 lb (76.7 kg)   SpO2 99%   BMI 26.87 kg/m?    ? ?Wt Readings from Last 3 Encounters:  ?01/02/22 169 lb (76.7 kg)  ?11/07/21 167 lb 12.8 oz (76.1 kg)  ?10/09/21 170 lb (77.1 kg)  ?  ? ?GEN:  Well nourished, well developed in no acute distress ?HEENT: Normal ?NECK: No JVD; No carotid bruits ?LYMPHATICS: No lymphadenopathy ?CARDIAC: RRR, no murmurs, rubs, gallops ?RESPIRATORY:  Clear to auscultation without rales, wheezing or rhonchi  ?ABDOMEN: Soft, non-tender, non-distended ?MUSCULOSKELETAL:  No edema; No deformity  ?SKIN: Warm and dry ?NEUROLOGIC:  Alert and oriented x 3 ?PSYCHIATRIC:  Normal affect  ? ? ?  ? ?ASSESSMENT:   ? ?1. Shortness of breath   ?2. SVT (supraventricular tachycardia) ([place])   ?3. Aortic atherosclerosis ([place])   ? ?PLAN:   ? ?In order of problems listed above: ? ?#SVT ?#SOB ?Likely AT.  Discussed  link between atrial tachycardia and PACs and atrial fibrillation during today's appointment. ?Recommend starting metoprolol tartrate 12.5 mg by mouth twice daily as needed for sustained palpitations. ? ? ?Stop aspirin. ? ?#Elevated blood pressure reading at prior appointment ?Recommend she check her blood pressures 1-2 times per week using an arm cuff at home and record these values.  She should bring this log to her primary care physician for review.   ? ?Follow-up as needed with EP ? ? ?Total time spent with patient today 45 minutes. This includes reviewing records, evaluating the patient and coordinating care. ? ?Medication Adjustments/Labs and Tests Ordered: ?Current medicines are reviewed at length with the patient today.  Concerns regarding medicines are outlined above.  ?No orders of the defined  types were placed in this encounter. ? ?No orders of the defined types were placed in this encounter. ? ? ? ?Signed, ?Lysbeth Galas T. Quentin Ore, MD, [facility], [place] ?01/02/2022 11:31 AM    ?Electrophysiology ?[place] ?

## 2022-01-28 NOTE — Progress Notes (Signed)
?  ? ? ?Established patient visit ? ? ?Patient: Yolanda Fox   DOB: [birth year]   61 y.o. Female  MRN: [id number] ?Visit Date: 01/29/2022 ? ?Today's healthcare provider: Dani Gobble Ltanya Bayley, PA-C  ?Introduced myself to the patient as a Journalist, newspaper and provided education on APPs in clinical practice.  ? ? ?I,Tiffany J Bragg,acting as a Education administrator for [employer], PA-C.,have documented all relevant documentation on the behalf of Ivee Poellnitz E Marlowe Lawes, PA-C,as directed by  [employer], PA-C while in the presence of Aggie Douse E Jazalynn Mireles, PA-C.  ? ?Chief Complaint  ?Patient presents with  ? Sore Throat  ?  Patient complains of sore throat for 3 days, with fever staying under 99. States she has congestion and drainage.   ? ?Subjective  ?  ?Sore Throat  ?Associated symptoms include ear pain, headaches and neck pain. Pertinent negatives include no congestion, coughing, diarrhea, shortness of breath, trouble swallowing or vomiting.  ?HPI   ? ? Sore Throat   ? Additional comments: Patient complains of sore throat for 3 days, with fever staying under 99. States she has congestion and drainage.  ? ?  ?  ?Last edited by Smitty Knudsen, CMA on 01/29/2022  8:14 AM.  ?  ?  ? ?States she has had a sore throat for the past 3 days  ?Reports fatigue, sore throat, headache, ?Reports mild fever of 99.5 ? ?Medications: ?Outpatient Medications Prior to Visit  ?Medication Sig  ? cholecalciferol (VITAMIN D3) 25 MCG (1000 UNIT) tablet every other day.  ? famotidine (PEPCID) 20 MG tablet Take by mouth.  ? fluticasone (FLONASE) 50 MCG/ACT nasal spray Place into both nostrils at bedtime as needed for allergies or rhinitis.  ? Ivermectin (SOOLANTRA) 1 % CREA Apply 1 application. topically at bedtime.  ? levothyroxine (SYNTHROID) 100 MCG tablet Take 1 tablet (100 mcg total) by mouth daily before breakfast.  ? loratadine (CLARITIN) 10 MG tablet Take 10 mg by mouth daily.  ? metoprolol tartrate (LOPRESSOR) 25 MG tablet Take 0.5 tablets (12.5 mg total) by mouth 2 (two) times daily  as needed (for palpitations.).  ? Multiple Vitamin (MULTIVITAMIN) capsule Take 1 capsule by mouth daily.  ? ?No facility-administered medications prior to visit.  ? ? ?Review of Systems  ?Constitutional:  Positive for fatigue and fever. Negative for chills and diaphoresis.  ?HENT:  Positive for ear pain and sore throat. Negative for congestion, postnasal drip, rhinorrhea and trouble swallowing.   ?Respiratory:  Negative for cough, shortness of breath and wheezing.   ?Gastrointestinal:  Negative for diarrhea, nausea and vomiting.  ?Musculoskeletal:  Positive for neck pain.  ?Neurological:  Positive for headaches. Negative for dizziness and light-headedness.  ? ? ?  Objective  ?  ?BP 123/71 (BP Location: Right Arm, Patient Position: Sitting, Cuff Size: Normal)   Pulse 70   Temp 98.9 ?F (37.2 ?C) (Oral)   Ht '5\' 6"'$  (1.676 m)   Wt 168 lb 3.2 oz (76.3 kg)   SpO2 100%   BMI 27.15 kg/m?  ? ? ?Physical Exam ?Vitals reviewed.  ?Constitutional:   ?   General: She is awake.  ?   Appearance: Normal appearance. She is well-developed and well-groomed.  ?HENT:  ?   Head: Normocephalic and atraumatic.  ?   Mouth/Throat:  ?   Mouth: Mucous membranes are moist.  ?   Pharynx: Uvula midline. Oropharyngeal exudate and posterior oropharyngeal erythema present. No pharyngeal swelling or uvula swelling.  ?Cardiovascular:  ?   Rate  and Rhythm: Normal rate and regular rhythm.  ?   Heart sounds: Normal heart sounds. No murmur heard. ?  No friction rub. No gallop.  ?Pulmonary:  ?   Effort: Pulmonary effort is normal. No respiratory distress.  ?   Breath sounds: Normal breath sounds. No wheezing, rhonchi or rales.  ?Neurological:  ?   Mental Status: She is alert.  ?Psychiatric:     ?   Attention and Perception: Attention normal.     ?   Mood and Affect: Mood normal.     ?   Speech: Speech normal.     ?   Behavior: Behavior normal. Behavior is cooperative.  ?  ? ? ?No results found for any visits on 01/29/22. ? Assessment & Plan  ?   ? ?Problem List Items Addressed This Visit   ?None ?Visit Diagnoses   ? ? Acute sore throat    -  Primary ?Acute, new problem for 3 days ?Reports suspected exposure to people with strep pharyngitis ?Exudates on pharynx are suspicious for potential bacterial etiology ?Rapid strep negative - will send culture for confirmation ?Reviewed tenants of at home symptomatic treatment pending culture results ?Culture results to dictate further management  ?Follow up as needed for persistent or progressing symptoms  ?  ? Relevant Orders  ? Culture, Group A Strep  ? ?  ? ? ? ?No follow-ups on file. ? ? ?I, Jadarious Dobbins E Lemon Whitacre, PA-C, have reviewed all documentation for this visit. The documentation on 01/29/22 for the exam, diagnosis, procedures, and orders are all accurate and complete. ? ? ?Garren Greenman, MHS, PA-C ?[facility] ?[facility]  ? ? ?

## 2022-01-29 ENCOUNTER — Ambulatory Visit: Payer: BC Managed Care – PPO | Admitting: Physician Assistant

## 2022-01-29 ENCOUNTER — Encounter: Payer: Self-pay | Admitting: Physician Assistant

## 2022-01-29 VITALS — BP 123/71 | HR 70 | Temp 98.9°F | Ht 66.0 in | Wt 168.2 lb

## 2022-01-29 DIAGNOSIS — J029 Acute pharyngitis, unspecified: Secondary | ICD-10-CM | POA: Diagnosis not present

## 2022-01-29 NOTE — Patient Instructions (Addendum)
Based on the results of the rapid strep we will need to wait to see what the strep culture says before an antibiotic should be sent in  ? ?For now, the main goal of therapy will be to keep you feeling better and more comfortable  ?You can do this by using Tylenol and Ibuprofen for pain ?Drinking warm tea with honey, cough drops and lozenges ? ?We will keep you updated with the results of the strep culture once they are available  ?

## 2022-02-01 LAB — CULTURE, GROUP A STREP: Strep A Culture: NEGATIVE

## 2022-04-23 ENCOUNTER — Other Ambulatory Visit: Payer: Self-pay | Admitting: Family Medicine

## 2022-04-23 ENCOUNTER — Encounter: Payer: Self-pay | Admitting: Family Medicine

## 2022-04-23 DIAGNOSIS — E039 Hypothyroidism, unspecified: Secondary | ICD-10-CM

## 2022-04-23 MED ORDER — PANTOPRAZOLE SODIUM 40 MG PO TBEC: 40.0000 mg | DELAYED_RELEASE_TABLET | Freq: Every day | ORAL | 3 refills | Status: DC

## 2022-04-23 MED ORDER — LEVOTHYROXINE SODIUM 100 MCG PO TABS: 100.0000 ug | ORAL_TABLET | Freq: Every day | ORAL | 3 refills | Status: DC

## 2022-09-15 IMAGING — MG MM DIGITAL SCREENING BILAT W/ TOMO AND CAD
6 of 10 series · 6 of 30 positions shown · non-contrast
Comparison: Previous exam(s).

CLINICAL DATA: Screening.

EXAM:
DIGITAL SCREENING BILATERAL MAMMOGRAM WITH TOMOSYNTHESIS AND CAD
TECHNIQUE: Bilateral screening digital craniocaudal and mediolateral oblique
mammograms were obtained. Bilateral screening digital breast
tomosynthesis was performed. The images were evaluated with
computer-aided detection.

[R MLO synth-2D]
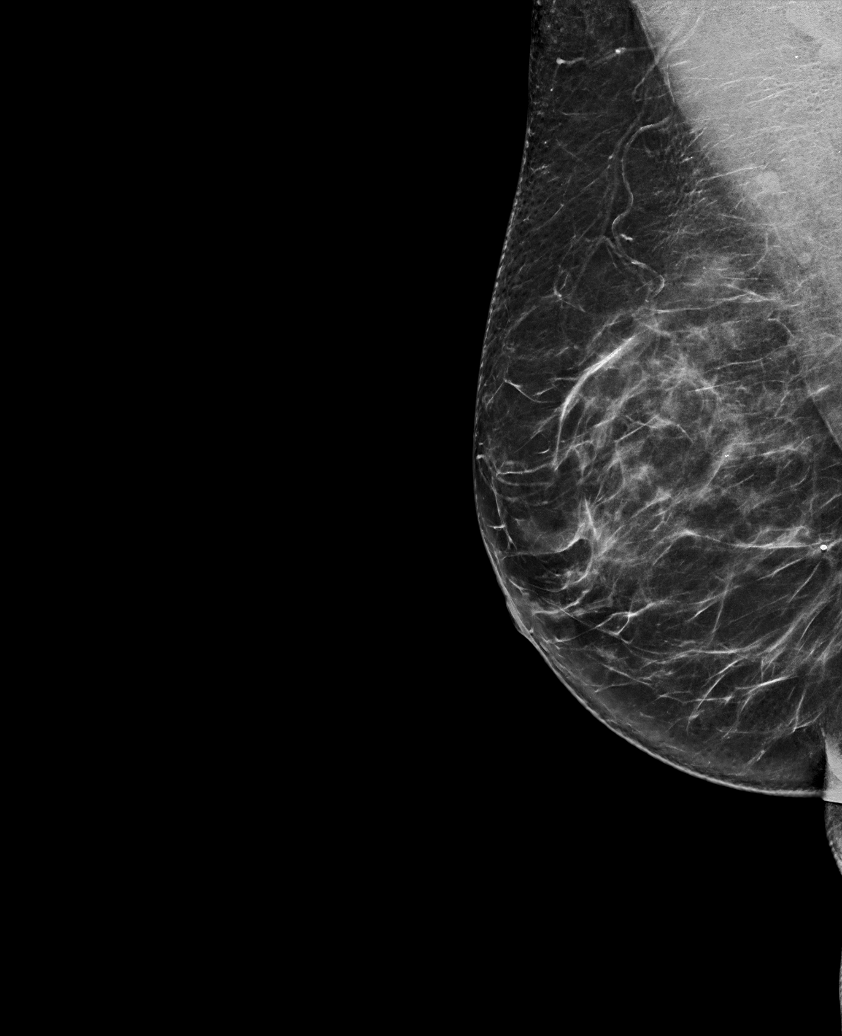

[R XCCL synth-2D]
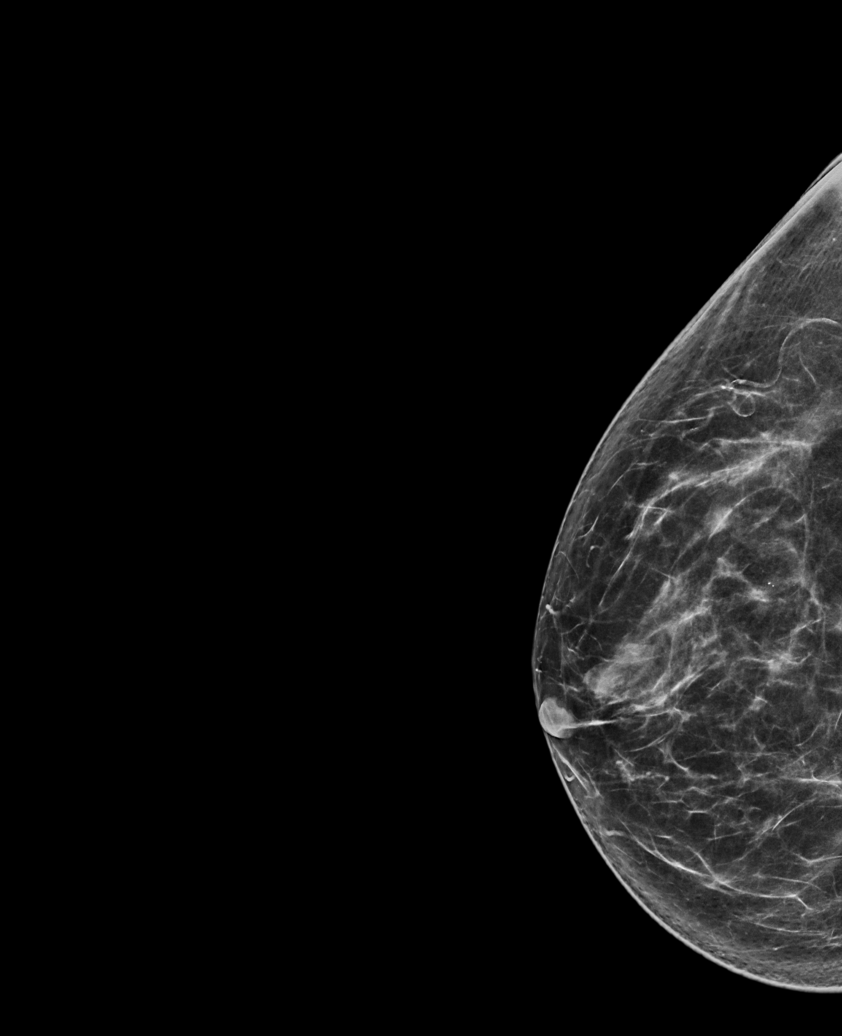

[L CC synth-2D]
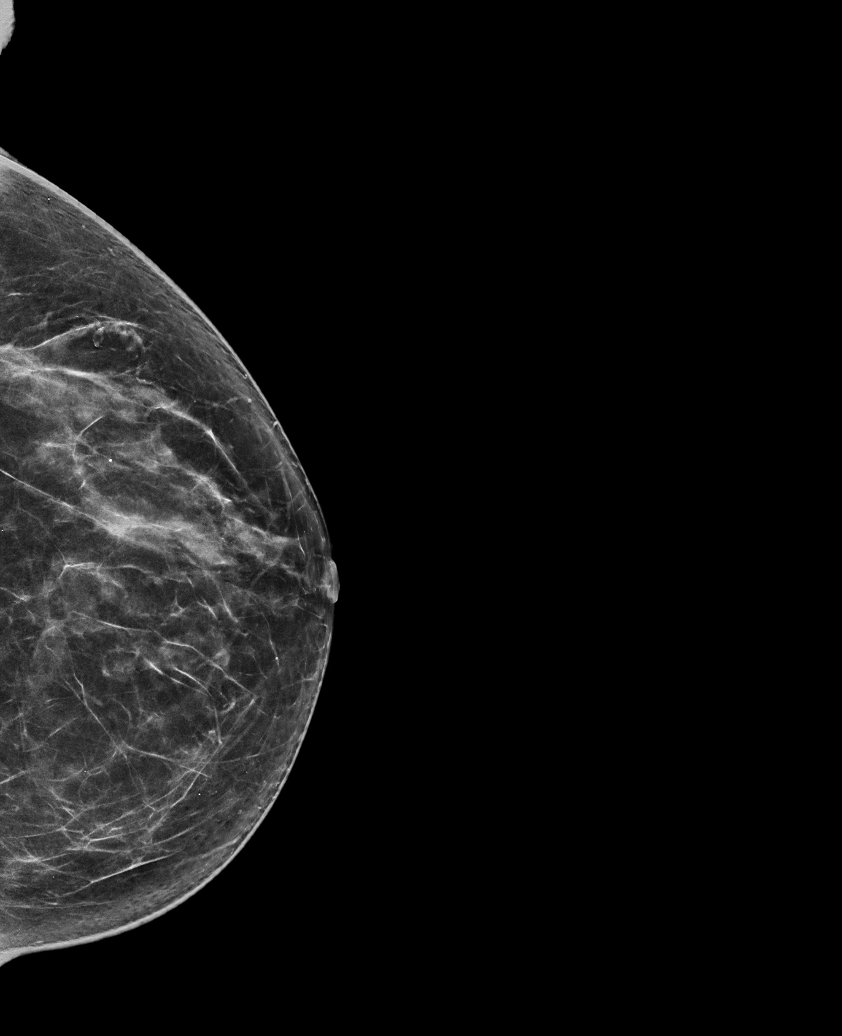

[R CC synth-2D]
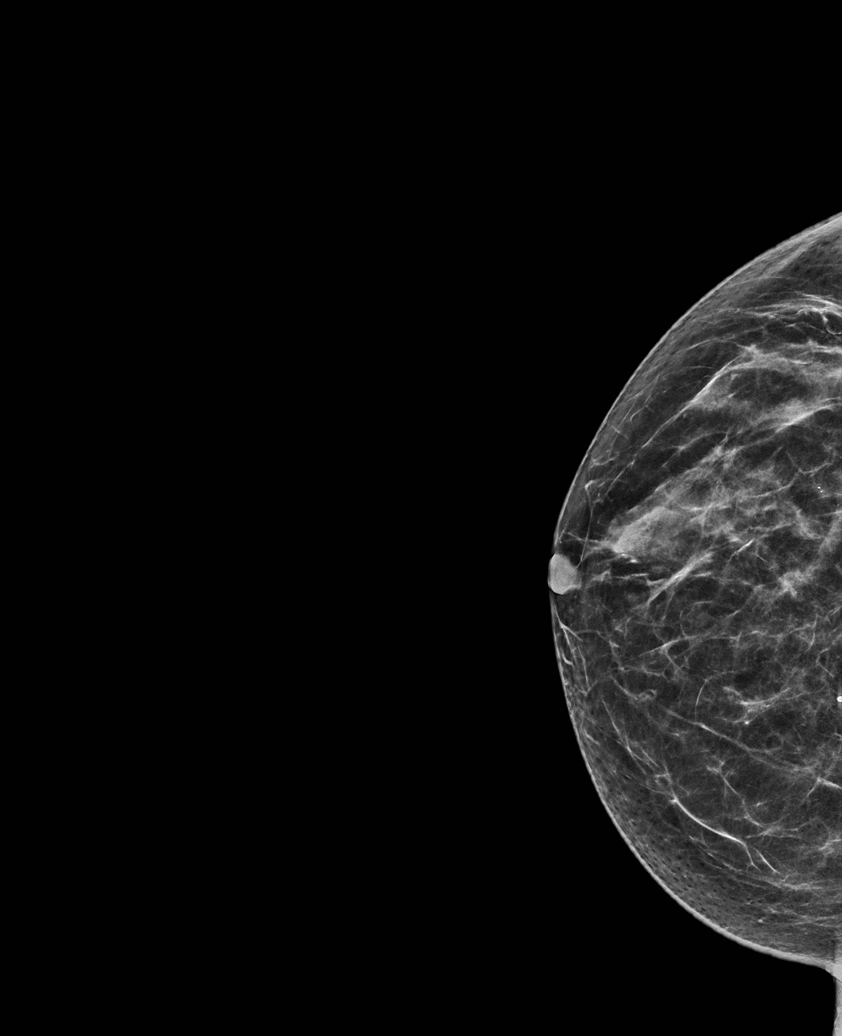

[L MLO synth-2D]
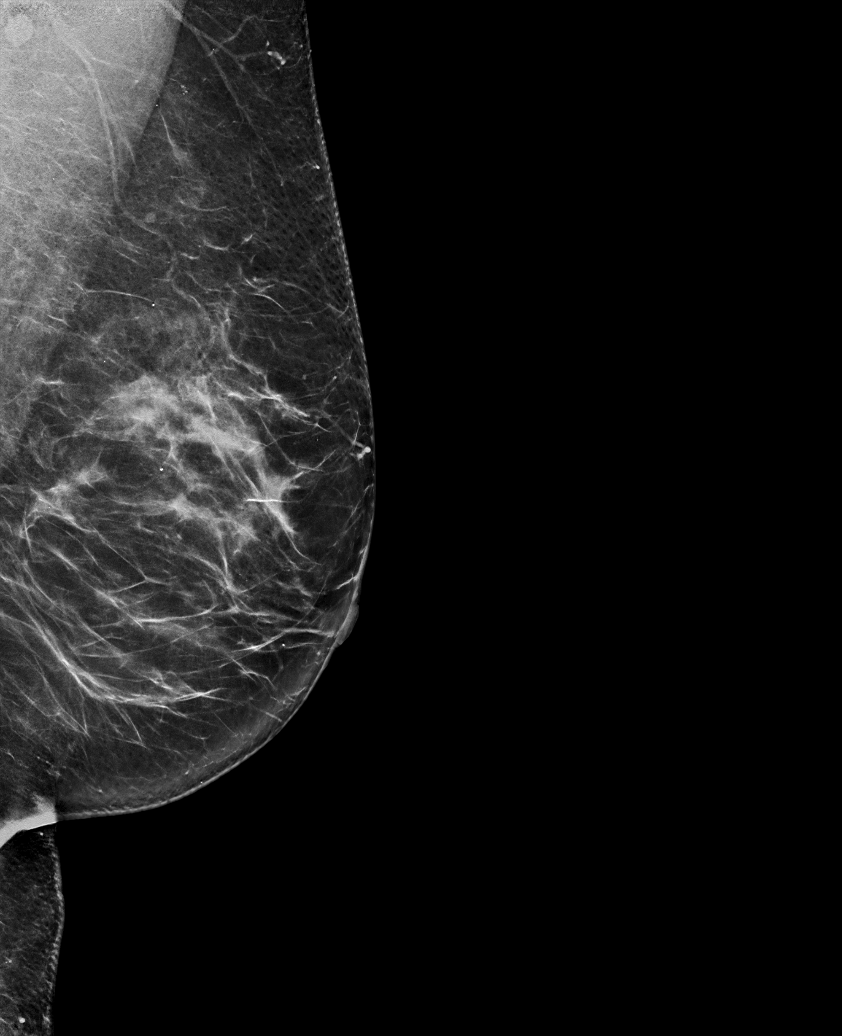

[R XCCL tomo · tomo slice 41/82.0]
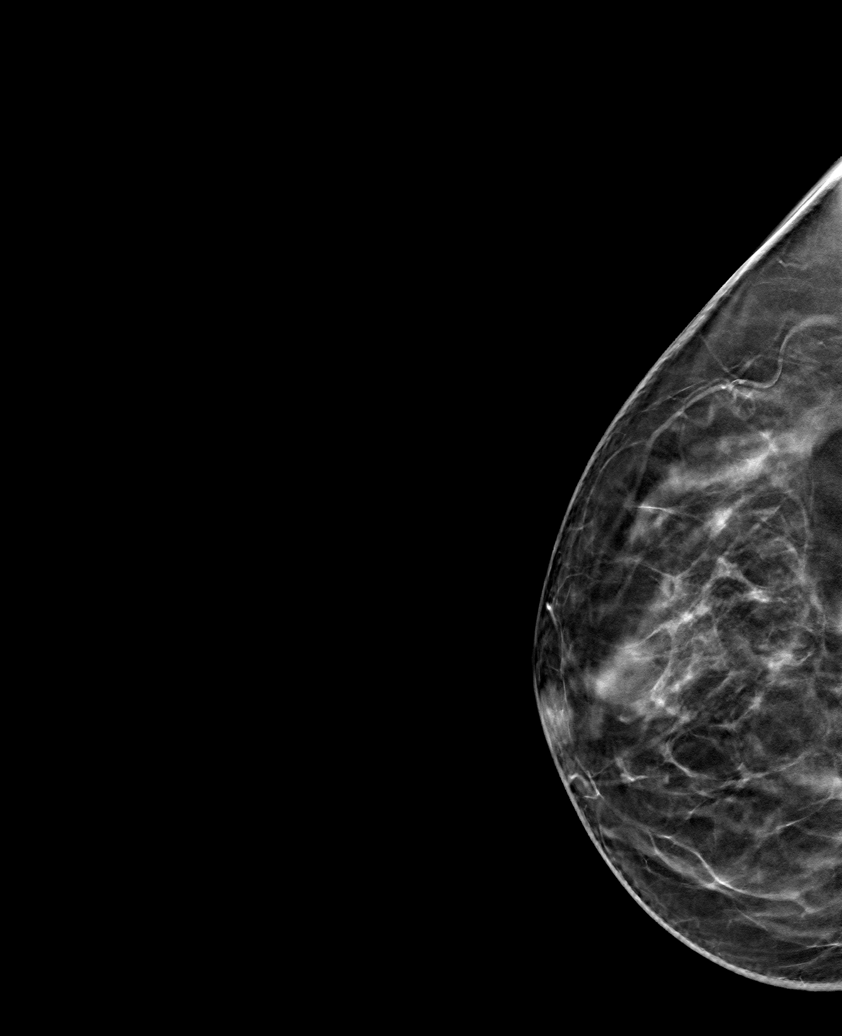

[6 of 30 positions shown; findings below may reference images not displayed]

ACR Breast Density Category c: The breast tissue is heterogeneously
dense, which may obscure small masses.
FINDINGS: There are no findings suspicious for malignancy.
IMPRESSION: No mammographic evidence of malignancy. A result letter of this
screening mammogram will be mailed directly to the patient.

RECOMMENDATION:
Screening mammogram in one year. (Code:Q3-W-BC3)

BI-RADS CATEGORY  1: Negative.

## 2022-10-10 ENCOUNTER — Encounter: Payer: BC Managed Care – PPO | Admitting: Family Medicine

## 2022-10-16 NOTE — Progress Notes (Unsigned)
I,Connie R Striblin,acting as a Education administrator for Gwyneth Sprout, FNP.,have documented all relevant documentation on the behalf of Gwyneth Sprout, FNP,as directed by  Gwyneth Sprout, FNP while in the presence of Gwyneth Sprout, FNP.  Complete physical exam  Patient: Yolanda Fox   DOB: 02-03-1961   62 y.o. Female  MRN: [id number] Visit Date: 10/17/2022  Today's healthcare provider: Gwyneth Sprout, FNP  Introduced to nurse practitioner role and practice setting.  All questions answered.  Discussed provider/patient relationship and expectations.  Subjective    Yolanda Fox is a 62 y.o. female who presents today for a complete physical exam.  She reports consuming a general diet. Gym/ health club routine includes cardio. She generally feels well. She reports sleeping well. She does not have additional problems to discuss today.   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  Past Medical History:  Diagnosis Date   Acute pharyngitis    Benign neoplasm of colon    Bicornuate uterus    Degeneration of intervertebral disc, site unspecified    Esophageal reflux    External thrombosed hemorrhoids    Hematuria, unspecified    Nontoxic uninodular goiter    Osteoarthrosis, unspecified whether generalized or localized, unspecified site    Pulmonary nodules/lesions, multiple 09/21/2020   Thyroid nodule 03/01/2015   Left; s/p right thyroidectomy   Unspecified adjustment reaction    Unspecified hypothyroidism    Unspecified vitamin D deficiency    Past Surgical History:  Procedure Laterality Date   THYROIDECTOMY, PARTIAL  1991   TONSILLECTOMY  1985   Social History   Socioeconomic History   Marital status: Married    Spouse name: Not on file   Number of children: Not on file   Years of education: Not on file   Highest education level: Not on file  Occupational  History   Not on file  Tobacco Use   Smoking status: Never   Smokeless tobacco: Never  Vaping Use   Vaping Use: Never used  Substance and Sexual Activity   Alcohol use: Yes    Alcohol/week: 0.0 standard drinks of alcohol    Comment: occassionally   Drug use: No   Sexual activity: Not on file  Other Topics Concern   Not on file  Social History Narrative   Not on file   Social Determinants of Health   Financial Resource Strain: Not on file  Food Insecurity: Not on file  Transportation Needs: Not on file  Physical Activity: Not on file  Stress: Not on file  Social Connections: Not on file  Intimate Partner Violence: Not on file   Family Status  Relation Name Status   Mother  Deceased       rheumatoid arthritis, peptic ulcer, gastroesophageal reflux disease, emphysema, hiatal hernia, gallbladder disease, colon polyps   Father  Deceased       allergies, hypertension, arthritis, coronary artery disease, renal disease, alcoholism, colon polyps   Sister  Alive       back surgery   Brother  Alive       back problems   Brother  Alive       diabetes mellitus type II, Hypertension,back problems   Sister  Deceased       mva   Neg Hx  (Not Specified)   Family History  Problem  Relation Age of Onset   Heart disease Father    Breast cancer Neg Hx    Allergies  Allergen Reactions   Codeine    Penicillins     Patient Care Team: Gwyneth Sprout, FNP as PCP - General (Family Medicine) Vickie Epley, MD as PCP - Electrophysiology (Cardiology)   Medications: Outpatient Medications Prior to Visit  Medication Sig   cholecalciferol (VITAMIN D3) 25 MCG (1000 UNIT) tablet every other day.   cyclobenzaprine (FLEXERIL) 5 MG tablet Take 5 mg by mouth at bedtime.   fluticasone (FLONASE) 50 MCG/ACT nasal spray Place into both nostrils at bedtime as needed for allergies or rhinitis.   Ivermectin (SOOLANTRA) 1 % CREA Apply 1 application. topically at bedtime.   levothyroxine  (SYNTHROID) 100 MCG tablet Take 1 tablet (100 mcg total) by mouth daily before breakfast.   loratadine (CLARITIN) 10 MG tablet Take 10 mg by mouth daily.   metoprolol tartrate (LOPRESSOR) 25 MG tablet Take 0.5 tablets (12.5 mg total) by mouth 2 (two) times daily as needed (for palpitations.).   Multiple Vitamin (MULTIVITAMIN) capsule Take 1 capsule by mouth daily.   pantoprazole (PROTONIX) 40 MG tablet Take 1 tablet (40 mg total) by mouth daily.   [DISCONTINUED] famotidine (PEPCID) 20 MG tablet Take by mouth.   No facility-administered medications prior to visit.    Review of Systems  Musculoskeletal:  Positive for myalgias.   L shoulder tightness; believes it is an overuse injury. Working on stretching.    Objective    BP 127/71 (BP Location: Right Arm, Patient Position: Sitting, Cuff Size: Normal)   Pulse 72   Temp 98.3 F (36.8 C) (Oral)   Ht '5\' 6"'$  (1.676 m)   Wt 164 lb 12.8 oz (74.8 kg)   SpO2 100%   BMI 26.60 kg/m     Physical Exam Vitals and nursing note reviewed.  Constitutional:      General: She is awake. She is not in acute distress.    Appearance: Normal appearance. She is well-developed, well-groomed and overweight. She is not ill-appearing, toxic-appearing or diaphoretic.  HENT:     Head: Normocephalic and atraumatic.     Jaw: There is normal jaw occlusion. No trismus, tenderness, swelling or pain on movement.     Right Ear: Hearing, tympanic membrane, ear canal and external ear normal. There is no impacted cerumen.     Left Ear: Hearing, tympanic membrane, ear canal and external ear normal. There is no impacted cerumen.     Nose: Nose normal. No congestion or rhinorrhea.     Right Turbinates: Not enlarged, swollen or pale.     Left Turbinates: Not enlarged, swollen or pale.     Right Sinus: No maxillary sinus tenderness or frontal sinus tenderness.     Left Sinus: No maxillary sinus tenderness or frontal sinus tenderness.     Mouth/Throat:     Lips: Pink.      Mouth: Mucous membranes are moist. No injury.     Tongue: No lesions.     Pharynx: Oropharynx is clear. Uvula midline. No pharyngeal swelling, oropharyngeal exudate, posterior oropharyngeal erythema or uvula swelling.     Tonsils: No tonsillar exudate or tonsillar abscesses.  Eyes:     General: Lids are normal. Lids are everted, no foreign bodies appreciated. Vision grossly intact. Gaze aligned appropriately. No allergic shiner or visual field deficit.       Right eye: No discharge.        Left eye: No  discharge.     Extraocular Movements: Extraocular movements intact.     Conjunctiva/sclera: Conjunctivae normal.     Right eye: Right conjunctiva is not injected. No exudate.    Left eye: Left conjunctiva is not injected. No exudate.    Pupils: Pupils are equal, round, and reactive to light.  Neck:     Thyroid: No thyroid mass, thyromegaly or thyroid tenderness.     Vascular: No carotid bruit.     Trachea: Trachea normal.  Cardiovascular:     Rate and Rhythm: Normal rate and regular rhythm.     Pulses: Normal pulses.          Carotid pulses are 2+ on the right side and 2+ on the left side.      Radial pulses are 2+ on the right side and 2+ on the left side.       Dorsalis pedis pulses are 2+ on the right side and 2+ on the left side.       Posterior tibial pulses are 2+ on the right side and 2+ on the left side.     Heart sounds: Normal heart sounds, S1 normal and S2 normal. No murmur heard.    No friction rub. No gallop.  Pulmonary:     Effort: Pulmonary effort is normal. No respiratory distress.     Breath sounds: Normal breath sounds and air entry. No stridor. No wheezing, rhonchi or rales.  Chest:     Chest wall: No tenderness.     Comments: Breasts: risk and benefit of breast self-exam was discussed, not examined  Abdominal:     General: Abdomen is flat. Bowel sounds are normal. There is no distension.     Palpations: Abdomen is soft. There is no mass.     Tenderness:  There is no abdominal tenderness. There is no right CVA tenderness, left CVA tenderness, guarding or rebound.     Hernia: No hernia is present.  Genitourinary:    Comments: Exam deferred; denies complaints Musculoskeletal:        General: No swelling, tenderness, deformity or signs of injury. Normal range of motion.     Cervical back: Full passive range of motion without pain, normal range of motion and neck supple. No edema, rigidity or tenderness. No muscular tenderness.     Right lower leg: No edema.     Left lower leg: No edema.  Lymphadenopathy:     Cervical: No cervical adenopathy.     Right cervical: No superficial, deep or posterior cervical adenopathy.    Left cervical: No superficial, deep or posterior cervical adenopathy.  Skin:    General: Skin is warm and dry.     Capillary Refill: Capillary refill takes less than 2 seconds.     Coloration: Skin is not jaundiced or pale.     Findings: No bruising, erythema, lesion or rash.  Neurological:     General: No focal deficit present.     Mental Status: She is alert and oriented to person, place, and time. Mental status is at baseline.     GCS: GCS eye subscore is 4. GCS verbal subscore is 5. GCS motor subscore is 6.     Sensory: Sensation is intact. No sensory deficit.     Motor: Motor function is intact. No weakness.     Coordination: Coordination is intact. Coordination normal.     Gait: Gait is intact. Gait normal.  Psychiatric:        Attention and Perception: Attention and  perception normal.        Mood and Affect: Mood and affect normal.        Speech: Speech normal.        Behavior: Behavior normal. Behavior is cooperative.        Thought Content: Thought content normal.        Cognition and Memory: Cognition and memory normal.        Judgment: Judgment normal.     Last depression screening scores    10/17/2022   10:05 AM 01/29/2022    8:26 AM 10/09/2021    1:22 PM  PHQ 2/9 Scores  PHQ - 2 Score 0 0 0  PHQ- 9 Score  0 2    Last fall risk screening    10/17/2022   10:05 AM  [place] in the past year? 0  Number falls in past yr: 0  Injury with Fall? 0   Last Audit-C alcohol use screening    10/17/2022   10:06 AM  Alcohol Use Disorder Test (AUDIT)  1. How often do you have a drink containing alcohol? 1  2. How many drinks containing alcohol do you have on a typical day when you are drinking? 0  3. How often do you have six or more drinks on one occasion? 0  AUDIT-C Score 1   A score of 3 or more in women, and 4 or more in men indicates increased risk for alcohol abuse, EXCEPT if all of the points are from question 1   No results found for any visits on 10/17/22.  Assessment & Plan    Routine Health Maintenance and Physical Exam  Exercise Activities and Dietary recommendations  Goals   None     Immunization History  Administered Date(s) Administered   Influenza,inj,Quad PF,6+ Mos 07/06/2019   Influenza-Unspecified 07/15/2017, 06/26/2018, 06/14/2020, 06/19/2021   PFIZER Comirnaty(Gray Top)Covid-19 Tri-Sucrose Vaccine 11/23/2019, 12/14/2019, 08/15/2020   Pfizer Covid-19 Vaccine Bivalent Booster 56yr & up 08/14/2021   Tdap 01/30/2007, 04/27/2020   Zoster Recombinat (Shingrix) 07/18/2021, 10/09/2021    Health Maintenance  Topic Date Due   COVID-19 Vaccine (5 - 2023-24 season) 05/17/2022   INFLUENZA VACCINE  12/15/2022 (Originally 04/16/2022)   COLONOSCOPY (Pts 45-458yrInsurance coverage will need to be confirmed)  06/03/2023   MAMMOGRAM  12/20/2023   PAP SMEAR-Modifier  10/09/2026   DTaP/Tdap/Td (3 - Td or Tdap) 04/27/2030   Hepatitis C Screening  Completed   HIV Screening  Completed   Zoster Vaccines- Shingrix  Completed   HPV VACCINES  Aged Out    Discussed health benefits of physical activity, and encouraged her to engage in regular exercise appropriate for her age and condition.  Problem List Items Addressed This Visit       Endocrine   Hypothyroidism    Chronic,  stable Repeat TSH Endorses palpitations and cold hands Continue levothyroxine 100 mcg       Relevant Orders   TSH     Other   Annual physical exam - Primary    UTD on dental and vision Due for mammogram PAP UTD UTD on colon cancer screening Wears eye glasses Things to do to keep yourself healthy  - Exercise at least 30-45 minutes a day, 3-4 days a week.  - Eat a low-fat diet with lots of fruits and vegetables, up to 7-9 servings per day.  - Seatbelts can save your life. Wear them always.  - Smoke detectors on every level of your home, check  batteries every year.  - Eye Doctor - have an eye exam every 1-2 years  - Safe sex - if you may be exposed to STDs, use a condom.  - Alcohol -  If you drink, do it moderately, less than 2 drinks per day.  - [place]. Choose someone to speak for you if you are not able.  - Depression is common in our stressful world.If you're feeling down or losing interest in things you normally enjoy, please come in for a visit.  - Violence - If anyone is threatening or hurting you, please call immediately.       Relevant Orders   Comprehensive metabolic panel   Hemoglobin A1c   Lipid panel   CBC   TSH   MM 3D SCREEN BREAST BILATERAL   Prediabetes    Chronic, stable Controlled with diet and exercise Repeat A1c Continue to recommend balanced, lower carb meals. Smaller meal size, adding snacks. Choosing water as drink of choice and increasing purposeful exercise.       Relevant Orders   Hemoglobin A1c   Visit for screening mammogram    Due for screening for mammogram, denies breast concerns, provided with phone number to call and schedule appointment for mammogram. Encouraged to repeat breast cancer screening every 1-2 years.       Relevant Orders   MM 3D SCREEN BREAST BILATERAL   Return in about 1 year (around 10/18/2023) for annual examination.    Vonna Kotyk, FNP, have reviewed all documentation for this visit.  The documentation on 10/17/22 for the exam, diagnosis, procedures, and orders are all accurate and complete.  Gwyneth Sprout, [place] [phone] (phone) [phone] (fax)  [place]

## 2022-10-17 ENCOUNTER — Ambulatory Visit (INDEPENDENT_AMBULATORY_CARE_PROVIDER_SITE_OTHER): Payer: No Typology Code available for payment source | Admitting: Family Medicine

## 2022-10-17 ENCOUNTER — Encounter: Payer: Self-pay | Admitting: Family Medicine

## 2022-10-17 VITALS — BP 127/71 | HR 72 | Temp 98.3°F | Ht 66.0 in | Wt 164.8 lb

## 2022-10-17 DIAGNOSIS — Z1231 Encounter for screening mammogram for malignant neoplasm of breast: Secondary | ICD-10-CM | POA: Diagnosis not present

## 2022-10-17 DIAGNOSIS — E039 Hypothyroidism, unspecified: Secondary | ICD-10-CM

## 2022-10-17 DIAGNOSIS — R7303 Prediabetes: Secondary | ICD-10-CM | POA: Diagnosis not present

## 2022-10-17 DIAGNOSIS — Z0000 Encounter for general adult medical examination without abnormal findings: Secondary | ICD-10-CM | POA: Diagnosis not present

## 2022-10-17 NOTE — Assessment & Plan Note (Signed)
UTD on dental and vision Due for mammogram PAP UTD UTD on colon cancer screening Wears eye glasses Things to do to keep yourself healthy  - Exercise at least 30-45 minutes a day, 3-4 days a week.  - Eat a low-fat diet with lots of fruits and vegetables, up to 7-9 servings per day.  - Seatbelts can save your life. Wear them always.  - Smoke detectors on every level of your home, check batteries every year.  - Eye Doctor - have an eye exam every 1-2 years  - Safe sex - if you may be exposed to STDs, use a condom.  - Alcohol -  If you drink, do it moderately, less than 2 drinks per day.  - [place]. Choose someone to speak for you if you are not able.  - Depression is common in our stressful world.If you're feeling down or losing interest in things you normally enjoy, please come in for a visit.  - Violence - If anyone is threatening or hurting you, please call immediately.

## 2022-10-17 NOTE — Patient Instructions (Signed)
Please call and schedule your mammogram:  [facility]  [address], [address] [facility] [place],  [place]  [address] Get Driving Directions Main: [phone]  Sunday:Closed Monday:7:20 AM - 5:00 PM Tuesday:7:20 AM - 5:00 PM Wednesday:7:20 AM - 5:00 PM Thursday:7:20 AM - 5:00 PM Friday:7:20 AM - 4:30 PM Saturday:Closed  

## 2022-10-17 NOTE — Assessment & Plan Note (Signed)
Chronic, stable ?Controlled with diet and exercise ?Repeat A1c ?Continue to recommend balanced, lower carb meals. Smaller meal size, adding snacks. Choosing water as drink of choice and increasing purposeful exercise. ? ?

## 2022-10-17 NOTE — Assessment & Plan Note (Signed)
Due for screening for mammogram, denies breast concerns, provided with phone number to call and schedule appointment for mammogram. Encouraged to repeat breast cancer screening every 1-2 years.  

## 2022-10-17 NOTE — Assessment & Plan Note (Signed)
Chronic, stable Repeat TSH Endorses palpitations and cold hands Continue levothyroxine 100 mcg

## 2022-10-18 LAB — CBC
Hematocrit: 41.8 % (ref 34.0–46.6)
Hemoglobin: 13.9 g/dL (ref 11.1–15.9)
MCH: 29.7 pg (ref 26.6–33.0)
MCHC: 33.3 g/dL (ref 31.5–35.7)
MCV: 89 fL (ref 79–97)
Platelets: 335 10*3/uL (ref 150–450)
RBC: 4.68 x10E6/uL (ref 3.77–5.28)
RDW: 12.5 % (ref 11.7–15.4)
WBC: 7.1 10*3/uL (ref 3.4–10.8)

## 2022-10-18 LAB — LIPID PANEL
Chol/HDL Ratio: 3.3 ratio (ref 0.0–4.4)
Cholesterol, Total: 172 mg/dL (ref 100–199)
HDL: 52 mg/dL (ref >39)
LDL Chol Calc (NIH): 104 mg/dL — ABNORMAL HIGH (ref 0–99)
Triglycerides: 88 mg/dL (ref 0–149)
VLDL Cholesterol Cal: 16 mg/dL (ref 5–40)

## 2022-10-18 LAB — HEMOGLOBIN A1C
Est. average glucose Bld gHb Est-mCnc: 117 mg/dL
Hgb A1c MFr Bld: 5.7 % — ABNORMAL HIGH (ref 4.8–5.6)

## 2022-10-18 LAB — COMPREHENSIVE METABOLIC PANEL
ALT: 16 IU/L (ref 0–32)
AST: 18 IU/L (ref 0–40)
Albumin/Globulin Ratio: 2.4 — ABNORMAL HIGH (ref 1.2–2.2)
Albumin: 4.5 g/dL (ref 3.9–4.9)
Alkaline Phosphatase: 74 IU/L (ref 44–121)
BUN/Creatinine Ratio: 16 (ref 12–28)
BUN: 14 mg/dL (ref 8–27)
Bilirubin Total: 0.5 mg/dL (ref 0.0–1.2)
CO2: 25 mmol/L (ref 20–29)
Calcium: 9.6 mg/dL (ref 8.7–10.3)
Chloride: 104 mmol/L (ref 96–106)
Creatinine, Ser: 0.88 mg/dL (ref 0.57–1.00)
Globulin, Total: 1.9 g/dL (ref 1.5–4.5)
Glucose: 89 mg/dL (ref 70–99)
Potassium: 4.5 mmol/L (ref 3.5–5.2)
Sodium: 143 mmol/L (ref 134–144)
Total Protein: 6.4 g/dL (ref 6.0–8.5)
eGFR: 74 mL/min/{1.73_m2} (ref >59)

## 2022-10-18 LAB — TSH: TSH: 2.23 u[IU]/mL (ref 0.450–4.500)

## 2022-10-21 NOTE — Progress Notes (Signed)
Pre-diabetes remains stable at 5.7%. Continue to recommend balanced, lower carb meals. Smaller meal size, adding snacks. Choosing water as drink of choice and increasing purposeful exercise.  Elevated LDL/bad cholesterol. I continue to recommend diet low in saturated fat and regular exercise - 30 min at least 5 times per week. Risk of heart attack and or stroke remains stable.  The 10-year ASCVD risk score (Arnett DK, et al., 2019) is: 4.2%   Values used to calculate the score:     Age: 62 years     Sex: Female     Is Non-Hispanic African American: No     Diabetic: No     Tobacco smoker: No     Systolic Blood Pressure: 741 mmHg     Is BP treated: No     HDL Cholesterol: 52 mg/dL     Total Cholesterol: 172 mg/dL   All other labs are normal and stable.  Yolanda Fox, [name] [name], [name] [address] [phone] (phone) [phone] (fax) [name]

## 2022-12-24 ENCOUNTER — Ambulatory Visit
Admission: RE | Admit: 2022-12-24 | Discharge: 2022-12-24 | Disposition: A | Discharge status: Home / Self Care | Payer: No Typology Code available for payment source | Source: Ambulatory Visit | Attending: Family Medicine | Admitting: Family Medicine

## 2022-12-24 DIAGNOSIS — Z0000 Encounter for general adult medical examination without abnormal findings: Secondary | ICD-10-CM | POA: Diagnosis present

## 2022-12-24 DIAGNOSIS — Z1231 Encounter for screening mammogram for malignant neoplasm of breast: Secondary | ICD-10-CM | POA: Insufficient documentation

## 2022-12-25 NOTE — Progress Notes (Signed)
Hi Yolanda Fox  Normal mammogram; repeat in 1 year.  Please let us know if you have any questions.  Thank you,  Merita Norton, FNP

## 2023-01-06 ENCOUNTER — Ambulatory Visit: Payer: No Typology Code available for payment source | Admitting: Dermatology

## 2023-01-06 VITALS — BP 129/79 | HR 56

## 2023-01-06 DIAGNOSIS — D2372 Other benign neoplasm of skin of left lower limb, including hip: Secondary | ICD-10-CM

## 2023-01-06 DIAGNOSIS — Z1283 Encounter for screening for malignant neoplasm of skin: Secondary | ICD-10-CM | POA: Diagnosis not present

## 2023-01-06 DIAGNOSIS — L578 Other skin changes due to chronic exposure to nonionizing radiation: Secondary | ICD-10-CM

## 2023-01-06 DIAGNOSIS — L820 Inflamed seborrheic keratosis: Secondary | ICD-10-CM

## 2023-01-06 DIAGNOSIS — D229 Melanocytic nevi, unspecified: Secondary | ICD-10-CM

## 2023-01-06 DIAGNOSIS — L821 Other seborrheic keratosis: Secondary | ICD-10-CM

## 2023-01-06 DIAGNOSIS — D225 Melanocytic nevi of trunk: Secondary | ICD-10-CM | POA: Diagnosis not present

## 2023-01-06 DIAGNOSIS — L719 Rosacea, unspecified: Secondary | ICD-10-CM

## 2023-01-06 DIAGNOSIS — D2271 Melanocytic nevi of right lower limb, including hip: Secondary | ICD-10-CM

## 2023-01-06 DIAGNOSIS — L570 Actinic keratosis: Secondary | ICD-10-CM

## 2023-01-06 DIAGNOSIS — D1801 Hemangioma of skin and subcutaneous tissue: Secondary | ICD-10-CM

## 2023-01-06 DIAGNOSIS — I8393 Asymptomatic varicose veins of bilateral lower extremities: Secondary | ICD-10-CM

## 2023-01-06 DIAGNOSIS — D239 Other benign neoplasm of skin, unspecified: Secondary | ICD-10-CM

## 2023-01-06 DIAGNOSIS — L814 Other melanin hyperpigmentation: Secondary | ICD-10-CM

## 2023-01-06 DIAGNOSIS — D2272 Melanocytic nevi of left lower limb, including hip: Secondary | ICD-10-CM

## 2023-01-06 DIAGNOSIS — D2261 Melanocytic nevi of right upper limb, including shoulder: Secondary | ICD-10-CM

## 2023-01-06 MED ORDER — IVERMECTIN 1 % EX CREA
1.0000 | TOPICAL_CREAM | Freq: Every evening | CUTANEOUS | 5 refills | Status: DC
Start: 2023-01-06 — End: 2023-11-11

## 2023-01-06 NOTE — Patient Instructions (Addendum)
Cryotherapy Aftercare  Wash gently with soap and water everyday.   Apply Vaseline and Band-Aid daily until healed.   Seborrheic Keratosis  What causes seborrheic keratoses? Seborrheic keratoses are harmless, common skin growths that first appear during adult life.  As time goes by, more growths appear.  Some people may develop a large number of them.  Seborrheic keratoses appear on both covered and uncovered body parts.  They are not caused by sunlight.  The tendency to develop seborrheic keratoses can be inherited.  They vary in color from skin-colored to gray, brown, or even black.  They can be either smooth or have a rough, warty surface.   Seborrheic keratoses are superficial and look as if they were stuck on the skin.  Under the microscope this type of keratosis looks like layers upon layers of skin.  That is why at times the top layer may seem to fall off, but the rest of the growth remains and re-grows.    Treatment Seborrheic keratoses do not need to be treated, but can easily be removed in the office.  Seborrheic keratoses often cause symptoms when they rub on clothing or jewelry.  Lesions can be in the way of shaving.  If they become inflamed, they can cause itching, soreness, or burning.  Removal of a seborrheic keratosis can be accomplished by freezing, burning, or surgery. If any spot bleeds, scabs, or grows rapidly, please return to have it checked, as these can be an indication of a skin cancer.    Melanoma ABCDEs  Melanoma is the most dangerous type of skin cancer, and is the leading cause of death from skin disease.  You are more likely to develop melanoma if you: Have light-colored skin, light-colored eyes, or red or blond hair Spend a lot of time in the sun Tan regularly, either outdoors or in a tanning bed Have had blistering sunburns, especially during childhood Have a close family member who has had a melanoma Have atypical moles or large birthmarks  Early detection  of melanoma is key since treatment is typically straightforward and cure rates are extremely high if we catch it early.   The first sign of melanoma is often a change in a mole or a new dark spot.  The ABCDE system is a way of remembering the signs of melanoma.  A for asymmetry:  The two halves do not match. B for border:  The edges of the growth are irregular. C for color:  A mixture of colors are present instead of an even brown color. D for diameter:  Melanomas are usually (but not always) greater than 6mm - the size of a pencil eraser. E for evolution:  The spot keeps changing in size, shape, and color.  Please check your skin once per month between visits. You can use a small mirror in front and a large mirror behind you to keep an eye on the back side or your body.   If you see any new or changing lesions before your next follow-up, please call to schedule a visit.  Please continue daily skin protection including broad spectrum sunscreen SPF 30+ to sun-exposed areas, reapplying every 2 hours as needed when you're outdoors.   Staying in the shade or wearing long sleeves, sun glasses (UVA+UVB protection) and wide brim hats (4-inch brim around the entire circumference of the hat) are also recommended for sun protection.    Due to recent changes in healthcare laws, you may see results of your pathology and/or   laboratory studies on MyChart before the doctors have had a chance to review them. We understand that in some cases there may be results that are confusing or concerning to you. Please understand that not all results are received at the same time and often the doctors may need to interpret multiple results in order to provide you with the best plan of care or course of treatment. Therefore, we ask that you please give us 2 business days to thoroughly review all your results before contacting the office for clarification. Should we see a critical lab result, you will be contacted  sooner.   If You Need Anything After Your Visit  If you have any questions or concerns for your doctor, please call our main line at [phone] and press option 4 to reach your doctor's medical assistant. If no one answers, please leave a voicemail as directed and we will return your call as soon as possible. Messages left after 4 pm will be answered the following business day.   You may also send us a message via MyChart. We typically respond to MyChart messages within 1-2 business days.  For prescription refills, please ask your pharmacy to contact our office. Our fax number is [phone].  If you have an urgent issue when the clinic is closed that cannot wait until the next business day, you can page your doctor at the number below.    Please note that while we do our best to be available for urgent issues outside of office hours, we are not available 24/7.   If you have an urgent issue and are unable to reach us, you may choose to seek medical care at your doctor's office, retail clinic, urgent care center, or emergency room.  If you have a medical emergency, please immediately call 911 or go to the emergency department.  Pager Numbers  - Dr. Kowalski: [phone]  - Dr. Moye: [phone]  - Dr. Stewart: [phone]  In the event of inclement weather, please call our main line at [phone] for an update on the status of any delays or closures.  Dermatology Medication Tips: Please keep the boxes that topical medications come in in order to help keep track of the instructions about where and how to use these. Pharmacies typically print the medication instructions only on the boxes and not directly on the medication tubes.   If your medication is too expensive, please contact our office at [phone] option 4 or send us a message through MyChart.   We are unable to tell what your co-pay for medications will be in advance as this is different depending on your insurance  coverage. However, we may be able to find a substitute medication at lower cost or fill out paperwork to get insurance to cover a needed medication.   If a prior authorization is required to get your medication covered by your insurance company, please allow us 1-2 business days to complete this process.  Drug prices often vary depending on where the prescription is filled and some pharmacies may offer cheaper prices.  The website www.goodrx.com contains coupons for medications through different pharmacies. The prices here do not account for what the cost may be with help from insurance (it may be cheaper with your insurance), but the website can give you the price if you did not use any insurance.  - You can print the associated coupon and take it with your prescription to the pharmacy.  - You may also stop by our office   during regular business hours and pick up a GoodRx coupon card.  - If you need your prescription sent electronically to a different pharmacy, notify our office through [facility] MyChart or by phone at [phone] option 4.     Si Usted Necesita Algo Despus de Su Visita  Tambin puede enviarnos un mensaje a travs de MyChart. Por lo general respondemos a los mensajes de MyChart en el transcurso de 1 a 2 das hbiles.  Para renovar recetas, por favor pida a su farmacia que se ponga en contacto con nuestra oficina. Nuestro nmero de fax es el [phone].  Si tiene un asunto urgente cuando la clnica est cerrada y que no puede esperar hasta el siguiente da hbil, puede llamar/localizar a su doctor(a) al nmero que aparece a continuacin.   Por favor, tenga en cuenta que aunque hacemos todo lo posible para estar disponibles para asuntos urgentes fuera del horario de oficina, no estamos disponibles las 24 horas del da, los 7 das de la semana.   Si tiene un problema urgente y no puede comunicarse con nosotros, puede optar por buscar atencin mdica  en el consultorio de  su doctor(a), en una clnica privada, en un centro de atencin urgente o en una sala de emergencias.  Si tiene una emergencia mdica, por favor llame inmediatamente al 911 o vaya a la sala de emergencias.  Nmeros de bper  - Dr. Kowalski: [phone]  - Dra. Moye: [phone]  - Dra. Stewart: [phone]  En caso de inclemencias del tiempo, por favor llame a nuestra lnea principal al [phone] para una actualizacin sobre el estado de cualquier retraso o cierre.  Consejos para la medicacin en dermatologa: Por favor, guarde las cajas en las que vienen los medicamentos de uso tpico para ayudarle a seguir las instrucciones sobre dnde y cmo usarlos. Las farmacias generalmente imprimen las instrucciones del medicamento slo en las cajas y no directamente en los tubos del medicamento.   Si su medicamento es muy caro, por favor, pngase en contacto con nuestra oficina llamando al [phone] y presione la opcin 4 o envenos un mensaje a travs de MyChart.   No podemos decirle cul ser su copago por los medicamentos por adelantado ya que esto es diferente dependiendo de la cobertura de su seguro. Sin embargo, es posible que podamos encontrar un medicamento sustituto a menor costo o llenar un formulario para que el seguro cubra el medicamento que se considera necesario.   Si se requiere una autorizacin previa para que su compaa de seguros cubra su medicamento, por favor permtanos de 1 a 2 das hbiles para completar este proceso.  Los precios de los medicamentos varan con frecuencia dependiendo del lugar de dnde se surte la receta y alguna farmacias pueden ofrecer precios ms baratos.  El sitio web www.goodrx.com tiene cupones para medicamentos de diferentes farmacias. Los precios aqu no tienen en cuenta lo que podra costar con la ayuda del seguro (puede ser ms barato con su seguro), pero el sitio web puede darle el precio si no utiliz ningn seguro.  - Puede imprimir el  cupn correspondiente y llevarlo con su receta a la farmacia.  - Tambin puede pasar por nuestra oficina durante el horario de atencin regular y recoger una tarjeta de cupones de GoodRx.  - Si necesita que su receta se enve electrnicamente a una farmacia diferente, informe a nuestra oficina a travs de MyChart de [facility] o por telfono llamando al [phone] y presione la opcin 4.  

## 2023-01-06 NOTE — Progress Notes (Signed)
Follow-Up Visit   Subjective  Yolanda Fox is a 62 y.o. female who presents for the following: Skin Cancer Screening and Full Body Skin Exam  The patient presents for Total-Body Skin Exam (TBSE) for skin cancer screening and mole check. The patient has spots, moles and lesions to be evaluated, some may be new or changing. Spot on R shoulder is very irritated. She has a growth on the right lateral eyebrow that gets irritated with waxing. Rosacea is improved with Soolantra Cream.     The following portions of the chart were reviewed this encounter and updated as appropriate: medications, allergies, medical history  Review of Systems:  No other skin or systemic complaints except as noted in HPI or Assessment and Plan.  Objective  Well appearing patient in no apparent distress; mood and affect are within normal limits.  A full examination was performed including scalp, head, eyes, ears, nose, lips, neck, chest, axillae, abdomen, back, buttocks, bilateral upper extremities, bilateral lower extremities, hands, feet, fingers, toes, fingernails, and toenails. All findings within normal limits unless otherwise noted below.   Relevant physical exam findings are noted in the Assessment and Plan.  R lat eyebrow x 1, R post shoulder x 1,  L shoulder x 1 (3) Erythematous stuck-on, waxy papule or plaque  R nasal tip x 1 Pink scaly macule.    Assessment & Plan   LENTIGINES, SEBORRHEIC KERATOSES, HEMANGIOMAS - Benign normal skin lesions - Benign-appearing - Call for any changes  MELANOCYTIC NEVI - Tan-brown and/or pink-flesh-colored symmetric macules and papules  Left Lower Back: 2.11mm med dark brown macule   Left Medial Thigh: 2.11mm med dark brown macule   Left lateral breast: 7.0 x 6.20mm speckled brown macule   Right lower flank: 7.0 x 4.43mm brown pap    Right lateral breast: 2.0 mm med dark brown macules x 2  Right upper calf: 3.0 mm thin brown papule, 2 tone  Right  lateral popliteal: 3.0 mm med dark brown macule, recheck on f/up  Right anterior shoulder: 4.0 mm brown papule with emerging hair  - Benign appearing on exam today, stable. - Observation - Call clinic for new or changing moles - Recommend daily use of broad spectrum spf 30+ sunscreen to sun-exposed areas.   ACTINIC DAMAGE - Chronic condition, secondary to cumulative UV/sun exposure - diffuse scaly erythematous macules with underlying dyspigmentation - Recommend daily broad spectrum sunscreen SPF 30+ to sun-exposed areas, reapply every 2 hours as needed.  - Staying in the shade or wearing long sleeves, sun glasses (UVA+UVB protection) and wide brim hats (4-inch brim around the entire circumference of the hat) are also recommended for sun protection.  - Call for new or changing lesions.  SKIN CANCER SCREENING PERFORMED TODAY.   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 Currently well-controlled.   Rosacea is a chronic progressive skin condition usually affecting the face of adults, causing redness and/or acne bumps. It is treatable but not curable. It sometimes affects the eyes (ocular rosacea) as well. It may respond to topical and/or systemic medication and can flare with stress, sun exposure, alcohol, exercise, topical steroids (including hydrocortisone/cortisone 10) and some foods.  Daily application of broad spectrum spf 30+ sunscreen to face is recommended to reduce flares.   Treatment Plan Continue Soolantra Cream Apply to face QHS for rosacea.  DERMATOFIBROMA Exam: Firm pink/brown papulenodule with dimple sign of the left medial calf. Treatment  Plan: A dermatofibroma is a benign growth possibly related to trauma, such as an insect bite, cut from shaving, or inflamed acne-type bump.  Treatment options to remove include shave or excision with resulting scar and risk of recurrence.  Since  benign-appearing and not bothersome, will observe for now.   Varicose Veins/Spider Veins - Dilated blue, purple or red veins at the lower extremities - Reassured - Smaller vessels can be treated by sclerotherapy (a procedure to inject a medicine into the veins to make them disappear) if desired, but the treatment is not covered by insurance. Larger vessels may be covered if symptomatic and we would refer to vascular surgeon if treatment desired.   Inflamed seborrheic keratosis (3) R lat eyebrow x 1, R post shoulder x 1,  L shoulder x 1  Symptomatic, irritating, patient would like treated.  Destruction of lesion - R lat eyebrow x 1, R post shoulder x 1,  L shoulder x 1  Destruction method: cryotherapy   Informed consent: discussed and consent obtained   Lesion destroyed using liquid nitrogen: Yes   Region frozen until ice ball extended beyond lesion: Yes   Outcome: patient tolerated procedure well with no complications   Post-procedure details: wound care instructions given   Additional details:  Prior to procedure, discussed risks of blister formation, small wound, skin dyspigmentation, or rare scar following cryotherapy. Recommend Vaseline ointment to treated areas while healing.   AK (actinic keratosis) R nasal tip x 1  Actinic keratoses are precancerous spots that appear secondary to cumulative UV radiation exposure/sun exposure over time. They are chronic with expected duration over 1 year. A portion of actinic keratoses will progress to squamous cell carcinoma of the skin. It is not possible to reliably predict which spots will progress to skin cancer and so treatment is recommended to prevent development of skin cancer.  Recommend daily broad spectrum sunscreen SPF 30+ to sun-exposed areas, reapply every 2 hours as needed.  Recommend staying in the shade or wearing long sleeves, sun glasses (UVA+UVB protection) and wide brim hats (4-inch brim around the entire circumference of the  hat). Call for new or changing lesions.  Destruction of lesion - R nasal tip x 1  Destruction method: cryotherapy   Informed consent: discussed and consent obtained   Lesion destroyed using liquid nitrogen: Yes   Region frozen until ice ball extended beyond lesion: Yes   Outcome: patient tolerated procedure well with no complications   Post-procedure details: wound care instructions given   Additional details:  Prior to procedure, discussed risks of blister formation, small wound, skin dyspigmentation, or rare scar following cryotherapy. Recommend Vaseline ointment to treated areas while healing.   Rosacea  Related Medications Ivermectin (SOOLANTRA) 1 % CREA Apply 1 application  topically at bedtime.   Return in about 1 year (around 01/06/2024) for  recheck R lat politeal, TBSE.  ICherlyn Labella, CMA, am acting as scribe for Willeen Niece, MD .   Documentation: I have reviewed the above documentation for accuracy and completeness, and I agree with the above.  Willeen Niece, MD

## 2023-04-05 ENCOUNTER — Other Ambulatory Visit: Payer: Self-pay | Admitting: Family Medicine

## 2023-04-05 DIAGNOSIS — E039 Hypothyroidism, unspecified: Secondary | ICD-10-CM

## 2023-11-04 ENCOUNTER — Encounter: Payer: Self-pay | Admitting: Family Medicine

## 2023-11-04 ENCOUNTER — Ambulatory Visit: Payer: No Typology Code available for payment source | Admitting: Family Medicine

## 2023-11-04 VITALS — BP 128/65 | HR 55 | Ht 66.0 in | Wt 169.0 lb

## 2023-11-04 DIAGNOSIS — M25552 Pain in left hip: Secondary | ICD-10-CM

## 2023-11-04 DIAGNOSIS — Z0001 Encounter for general adult medical examination with abnormal findings: Secondary | ICD-10-CM | POA: Diagnosis not present

## 2023-11-04 DIAGNOSIS — K219 Gastro-esophageal reflux disease without esophagitis: Secondary | ICD-10-CM | POA: Diagnosis not present

## 2023-11-04 DIAGNOSIS — R7303 Prediabetes: Secondary | ICD-10-CM

## 2023-11-04 DIAGNOSIS — E039 Hypothyroidism, unspecified: Secondary | ICD-10-CM

## 2023-11-04 DIAGNOSIS — Z131 Encounter for screening for diabetes mellitus: Secondary | ICD-10-CM

## 2023-11-04 DIAGNOSIS — E663 Overweight: Secondary | ICD-10-CM

## 2023-11-04 DIAGNOSIS — Z13220 Encounter for screening for lipoid disorders: Secondary | ICD-10-CM

## 2023-11-04 DIAGNOSIS — Z130 Encounter for screening for diseases of the blood and blood-forming organs and certain disorders involving the immune mechanism: Secondary | ICD-10-CM

## 2023-11-04 DIAGNOSIS — Z0000 Encounter for general adult medical examination without abnormal findings: Secondary | ICD-10-CM

## 2023-11-04 MED ORDER — LEVOTHYROXINE SODIUM 100 MCG PO TABS
100.0000 ug | ORAL_TABLET | Freq: Every day | ORAL | 3 refills | Status: AC
Start: 2023-11-04 — End: ?

## 2023-11-04 MED ORDER — CYCLOBENZAPRINE HCL 5 MG PO TABS: 5.0000 mg | ORAL_TABLET | Freq: Every day | ORAL | 0 refills | Status: AC

## 2023-11-04 MED ORDER — PANTOPRAZOLE SODIUM 40 MG PO TBEC
40.0000 mg | DELAYED_RELEASE_TABLET | Freq: Every day | ORAL | 3 refills | Status: AC
Start: 2023-11-04 — End: ?

## 2023-11-04 MED ORDER — CYCLOBENZAPRINE HCL 5 MG PO TABS
5.0000 mg | ORAL_TABLET | Freq: Every day | ORAL | 0 refills | Status: DC
Start: 2023-11-04 — End: 2023-11-04

## 2023-11-04 NOTE — Assessment & Plan Note (Signed)
Chronic hip pain since 2018, progressively worsening. Pain localized in the hip flexor/psoas region, exacerbated by walking and concrete floors, alleviated by water aerobics. Severe enough to disrupt sleep and affect gait. Differential diagnosis includes arthritis, joint bony issues, or labrum wear. Unable to use NSAIDs due to adverse effects on hemorrhoids. Discussed treatments including Flexeril, meloxicam, steroid injections, and joint replacement. Prefers to avoid prednisone and is open to orthopedic referral. - Refer to orthopedics for further evaluation and management - Prescribe Flexeril as needed for muscle relaxation - Discuss potential for steroid injections or joint replacement with orthopedics

## 2023-11-04 NOTE — Progress Notes (Unsigned)
Complete physical exam   Patient: Yolanda Fox   DOB: April 04, 1961   63 y.o. Female  MRN: [id number] Visit Date: 11/04/2023  Today's healthcare provider: Ronnald Ramp, MD   Chief Complaint  Patient presents with   Annual Exam    No concern, needs med refilled    Health Maintenance    Denied Flu/ Covid vaccine  Had a colonoscopy done 9/23 by Dr [facility]    Hip Pain    L hip pain she has been dealing with this pain for years but the pain has gotten worst.    Subjective    Yolanda Fox is a 63 y.o. female who presents today for a complete physical exam.   She reports consuming a general and    diet. She aims for balanced meals with vegetables    Participates in water aerobics three days per week, very active through work      She does have additional problems to discuss today.   Discussed the use of AI scribe software for clinical note transcription with the patient, who gave verbal consent to proceed.  History of Present Illness   ELONI Fox is a 63 year old female who presents for an annual physical exam.  She declined flu and COVID vaccines. She engages in regular water aerobics and maintains a generally healthy diet with occasional indulgence in potato chips.  She has chronic hip pain that began after helping a friend paint a garage in late 2018. The pain has progressively worsened, affecting her gait and causing nocturnal pain. She has seen a chiropractor and massage therapist, which provided temporary relief. She avoids Tylenol due to constipation and Advil due to hemorrhoid bleeding. She has a prescription for Flexeril but has not used it recently. The pain is alleviated during water aerobics but worsens with walking, especially on hard surfaces.  She had a colonoscopy on May 24, 2022, which revealed a small sessile polyp in the rectum and internal hemorrhoids. The polyp was removed. She is nervous about the extended interval  between colonoscopies due to her history of findings.  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 Her diet includes meat and vegetables, with occasional vegetarian meals, and she has a weakness for potato chips.  Her family history includes her oldest son developing insulin-dependent diabetes at age 41 after having gestational diabetes. Her father had an aneurysm but did not smoke.         Past Medical History:  Diagnosis Date   Acute pharyngitis    Benign neoplasm of colon    Bicornuate uterus    Degeneration of intervertebral disc, site unspecified    Esophageal reflux    External thrombosed hemorrhoids    Hematuria, unspecified    Nontoxic uninodular goiter    Osteoarthrosis, unspecified whether generalized or localized, unspecified site    Pulmonary nodules/lesions, multiple 09/21/2020   Thyroid nodule 03/01/2015   Left; s/p right thyroidectomy   Unspecified adjustment reaction    Unspecified hypothyroidism    Unspecified vitamin D deficiency    Past Surgical History:  Procedure Laterality Date   THYROIDECTOMY, PARTIAL  1991   TONSILLECTOMY  1985   Social History   Socioeconomic History   Marital status: Married    Spouse name: Not on file   Number of children: Not on file   Years of education: Not on file  Highest education level: Associate degree: occupational, Scientist, product/process development, or vocational program  Occupational History   Not on file  Tobacco Use   Smoking status: Never   Smokeless tobacco: Never  Vaping Use   Vaping status: Never Used  Substance and Sexual Activity   Alcohol use: Yes    Alcohol/week: 0.0 standard drinks of alcohol    Comment: occassionally   Drug use: No   Sexual activity: Not Currently  Other Topics Concern   Not on file  Social History Narrative   Not on file   Social Drivers of Health   Financial Resource Strain: Low Risk  (11/03/2023)    Overall Financial Resource Strain (CARDIA)    Difficulty of Paying Living Expenses: Not hard at all  Food Insecurity: No Food Insecurity (11/03/2023)   Hunger Vital Sign    Worried About Running Out of Food in the Last Year: Never true    Ran Out of Food in the Last Year: Never true  Transportation Needs: No Transportation Needs (11/03/2023)   PRAPARE - Administrator, Civil Service (Medical): No    Lack of Transportation (Non-Medical): No  Physical Activity: Sufficiently Active (11/03/2023)   Exercise Vital Sign    Days of Exercise per Week: 4 days    Minutes of Exercise per Session: 40 min  Stress: No Stress Concern Present (11/03/2023)   [employer] of Occupational Health - Occupational Stress Questionnaire    Feeling of Stress : Not at all  Social Connections: Socially Integrated (11/03/2023)   Social Connection and Isolation Panel [NHANES]    Frequency of Communication with Friends and Family: More than three times a week    Frequency of Social Gatherings with Friends and Family: Three times a week    Attends Religious Services: More than 4 times per year    Active Member of Clubs or Organizations: Yes    Attends Banker Meetings: More than 4 times per year    Marital Status: Married  Catering manager Violence: Not At Risk (11/04/2023)   Humiliation, Afraid, Rape, and Kick questionnaire    Fear of Current or Ex-Partner: No    Emotionally Abused: No    Physically Abused: No    Sexually Abused: No   Family Status  Relation Name Status   Mother  Deceased       rheumatoid arthritis, peptic ulcer, gastroesophageal reflux disease, emphysema, hiatal hernia, gallbladder disease, colon polyps   Father  Deceased       allergies, hypertension, arthritis, coronary artery disease, renal disease, alcoholism, colon polyps   Sister  Alive       back surgery   Brother  Alive       back problems   Brother  Alive       diabetes mellitus type II, Hypertension,back  problems   Sister  Deceased       mva   Neg Hx  (Not Specified)  No partnership data on file   Family History  Problem Relation Age of Onset   Heart disease Father    Breast cancer Neg Hx    Allergies  Allergen Reactions   Codeine    Penicillins      Medications: Outpatient Medications Prior to Visit  Medication Sig   cholecalciferol (VITAMIN D3) 25 MCG (1000 UNIT) tablet every other day.   fluticasone (FLONASE) 50 MCG/ACT nasal spray Place into both nostrils at bedtime as needed for allergies or rhinitis.   Ivermectin (SOOLANTRA) 1 % CREA Apply  1 application  topically at bedtime.   loratadine (CLARITIN) 10 MG tablet Take 10 mg by mouth daily.   metoprolol tartrate (LOPRESSOR) 25 MG tablet Take 0.5 tablets (12.5 mg total) by mouth 2 (two) times daily as needed (for palpitations.).   Multiple Vitamin (MULTIVITAMIN) capsule Take 1 capsule by mouth daily.   [DISCONTINUED] cyclobenzaprine (FLEXERIL) 5 MG tablet Take 5 mg by mouth at bedtime.   [DISCONTINUED] pantoprazole (PROTONIX) 40 MG tablet TAKE 1 TABLET DAILY   [DISCONTINUED] SYNTHROID 100 MCG tablet TAKE 1 TABLET DAILY BEFORE BREAKFAST   No facility-administered medications prior to visit.    Review of Systems  Last CBC Lab Results  Component Value Date   WBC 7.1 10/17/2022   HGB 13.9 10/17/2022   HCT 41.8 10/17/2022   MCV 89 10/17/2022   MCH 29.7 10/17/2022   RDW 12.5 10/17/2022   PLT 335 10/17/2022   Last metabolic panel Lab Results  Component Value Date   GLUCOSE 89 10/17/2022   NA 143 10/17/2022   K 4.5 10/17/2022   CL 104 10/17/2022   CO2 25 10/17/2022   BUN 14 10/17/2022   CREATININE 0.88 10/17/2022   EGFR 74 10/17/2022   CALCIUM 9.6 10/17/2022   PROT 6.4 10/17/2022   ALBUMIN 4.5 10/17/2022   LABGLOB 1.9 10/17/2022   AGRATIO 2.4 (H) 10/17/2022   BILITOT 0.5 10/17/2022   ALKPHOS 74 10/17/2022   AST 18 10/17/2022   ALT 16 10/17/2022   Last lipids Lab Results  Component Value Date   CHOL 172  10/17/2022   HDL 52 10/17/2022   LDLCALC 104 (H) 10/17/2022   TRIG 88 10/17/2022   CHOLHDL 3.3 10/17/2022   Last hemoglobin A1c Lab Results  Component Value Date   HGBA1C 5.7 (H) 10/17/2022   Last thyroid functions Lab Results  Component Value Date   TSH 2.230 10/17/2022   Last vitamin D Lab Results  Component Value Date   VD25OH 43.7 05/01/2020   Last vitamin B12 and Folate No results found for: "VITAMINB12", "FOLATE"   {See past labs  Heme  Chem  Endocrine  Serology  Results Review (optional):1}  Objective    BP 128/65 (BP Location: Right Arm, Patient Position: Bed low/side rails up, Cuff Size: Normal)   Pulse (!) 55   Ht 5\' 6"  (1.676 m)   Wt 169 lb (76.7 kg)   SpO2 100%   BMI 27.28 kg/m  BP Readings from Last 3 Encounters:  11/04/23 128/65  01/06/23 129/79  10/17/22 127/71   Wt Readings from Last 3 Encounters:  11/04/23 169 lb (76.7 kg)  10/17/22 164 lb 12.8 oz (74.8 kg)  01/29/22 168 lb 3.2 oz (76.3 kg)    {See vitals history (optional):1}    Physical Exam Vitals reviewed.  Constitutional:      General: She is not in acute distress.    Appearance: Normal appearance. She is not ill-appearing, toxic-appearing or diaphoretic.  HENT:     Head: Normocephalic and atraumatic.     Right Ear: Tympanic membrane and external ear normal. There is no impacted cerumen.     Left Ear: Tympanic membrane and external ear normal. There is no impacted cerumen.     Nose: Nose normal.     Mouth/Throat:     Pharynx: Oropharynx is clear.  Eyes:     General: No scleral icterus.    Extraocular Movements: Extraocular movements intact.     Conjunctiva/sclera: Conjunctivae normal.     Pupils: Pupils are equal, round, and  reactive to light.  Cardiovascular:     Rate and Rhythm: Normal rate and regular rhythm.     Pulses: Normal pulses.     Heart sounds: Normal heart sounds. No murmur heard.    No friction rub. No gallop.  Pulmonary:     Effort: Pulmonary effort is  normal. No respiratory distress.     Breath sounds: Normal breath sounds. No wheezing, rhonchi or rales.  Abdominal:     General: Bowel sounds are normal. There is no distension.     Palpations: Abdomen is soft. There is no mass.     Tenderness: There is no abdominal tenderness. There is no guarding.  Musculoskeletal:     Cervical back: Normal range of motion and neck supple. No rigidity.     Left lower leg: Edema present.  Lymphadenopathy:     Cervical: No cervical adenopathy.  Skin:    General: Skin is warm.     Capillary Refill: Capillary refill takes less than 2 seconds.     Findings: No erythema or rash.  Neurological:     General: No focal deficit present.     Mental Status: She is alert and oriented to person, place, and time.     Motor: No weakness.     Gait: Gait normal.  Psychiatric:        Mood and Affect: Mood normal.        Behavior: Behavior normal.     Left Hip:  - Inspection: No gross deformity, LE trace left sided swelling, erythema, or ecchymosis - Palpation: No TTP, specifically none over greater trochanter - ROM: Normal range of motion on Flexion, extension, abduction, internal and external rotation, pain with abduction and flexion against resistance  - Strength: Normal strength. 5/5 - Neuro/vasc: NV intact distally     Last depression screening scores    11/04/2023   10:06 AM 10/17/2022   10:05 AM 01/29/2022    8:26 AM  PHQ 2/9 Scores  PHQ - 2 Score 0 0 0  PHQ- 9 Score 0 0 2    Last fall risk screening    10/17/2022   10:05 AM  Fall Risk   Falls in the past year? 0  Number falls in past yr: 0  Injury with Fall? 0    Last Audit-C alcohol use screening    11/03/2023    7:16 PM  Alcohol Use Disorder Test (AUDIT)  1. How often do you have a drink containing alcohol? 2  2. How many drinks containing alcohol do you have on a typical day when you are drinking? 0  3. How often do you have six or more drinks on one occasion? 0  AUDIT-C Score 2       Patient-reported   A score of 3 or more in women, and 4 or more in men indicates increased risk for alcohol abuse, EXCEPT if all of the points are from question 1   No results found for any visits on 11/04/23.  Assessment & Plan    Routine Health Maintenance and Physical Exam  Immunization History  Administered Date(s) Administered   Influenza,inj,Quad PF,6+ Mos 07/06/2019   Influenza-Unspecified 07/15/2017, 06/26/2018, 06/14/2020, 06/19/2021   PFIZER Comirnaty(Gray Top)Covid-19 Tri-Sucrose Vaccine 11/23/2019, 12/14/2019, 08/15/2020   Pfizer Covid-19 Vaccine Bivalent Booster 1yrs & up 08/14/2021   Tdap 01/30/2007, 04/27/2020   Zoster Recombinant(Shingrix) 07/18/2021, 10/09/2021    Health Maintenance  Topic Date Due   Colonoscopy  06/03/2023   COVID-19 Vaccine (5 - 2024-25 season)  11/20/2023 (Originally 05/18/2023)   INFLUENZA VACCINE  12/15/2023 (Originally 04/17/2023)   MAMMOGRAM  12/23/2024   Cervical Cancer Screening (HPV/Pap Cotest)  10/09/2026   DTaP/Tdap/Td (3 - Td or Tdap) 04/27/2030   Hepatitis C Screening  Completed   HIV Screening  Completed   Zoster Vaccines- Shingrix  Completed   HPV VACCINES  Aged Out    Problem List Items Addressed This Visit       Digestive   Acid reflux   Relevant Medications   pantoprazole (PROTONIX) 40 MG tablet   Other Relevant Orders   CBC     Endocrine   Hypothyroidism   Relevant Medications   levothyroxine (SYNTHROID) 100 MCG tablet   Other Relevant Orders   TSH+T4F+T3Free     Other   Prediabetes   Relevant Orders   Hemoglobin A1c   Pain of left hip   Chronic hip pain since 2018, progressively worsening. Pain localized in the hip flexor/psoas region, exacerbated by walking and concrete floors, alleviated by water aerobics. Severe enough to disrupt sleep and affect gait. Differential diagnosis includes arthritis, joint bony issues, or labrum wear. Unable to use NSAIDs due to adverse effects on hemorrhoids. Discussed  treatments including Flexeril, meloxicam, steroid injections, and joint replacement. Prefers to avoid prednisone and is open to orthopedic referral. - Refer to orthopedics for further evaluation and management - Prescribe Flexeril as needed for muscle relaxation - Discuss potential for steroid injections or joint replacement with orthopedics      Relevant Medications   cyclobenzaprine (FLEXERIL) 5 MG tablet   Other Relevant Orders   AMB referral to orthopedics   Annual physical exam - Primary   Annual physical examination. Declined flu and COVID vaccines. Reported colonoscopy in September 2023 with a small polyp removed and internal hemorrhoids identified. No colon cancer detected. Leads an active lifestyle with water aerobics and walking. - Order fasting labs including A1c, CBC, CMP, lipid panel, and thyroid studies - Request records from previous colonoscopy - Schedule follow-up in 6 months for general health check-in - recommended balanced diet and continued exercise       Other Visit Diagnoses       Screening for diabetes mellitus         Screening for lipid disorders       Relevant Orders   Lipid panel     Screening for deficiency anemia       Relevant Orders   CBC     Overweight (BMI 25.0-29.9)       Relevant Orders   CMP14+EGFR        Internal Hemorrhoids Internal hemorrhoids identified during colonoscopy on May 24, 2022. Grade II, non-bleeding, manually replaceable. Reports adverse effects from NSAIDs exacerbating hemorrhoids. - Avoid NSAIDs - Continue current management with Protonix         Return in about 6 months (around 05/03/2024) for CHRONIC F/U.       Ronnald Ramp, MD  [facility] [phone] (phone) [phone] (fax)  [facility]

## 2023-11-04 NOTE — Assessment & Plan Note (Signed)
Annual physical examination. Declined flu and COVID vaccines. Reported colonoscopy in September 2023 with a small polyp removed and internal hemorrhoids identified. No colon cancer detected. Leads an active lifestyle with water aerobics and walking. - Order fasting labs including A1c, CBC, CMP, lipid panel, and thyroid studies - Request records from previous colonoscopy - Schedule follow-up in 6 months for general health check-in - recommended balanced diet and continued exercise

## 2023-11-05 ENCOUNTER — Encounter: Payer: Self-pay | Admitting: Family Medicine

## 2023-11-05 LAB — CMP14+EGFR
ALT: 22 [IU]/L (ref 0–32)
AST: 21 [IU]/L (ref 0–40)
Albumin: 4.4 g/dL (ref 3.9–4.9)
Alkaline Phosphatase: 89 [IU]/L (ref 44–121)
BUN/Creatinine Ratio: 18 (ref 12–28)
BUN: 15 mg/dL (ref 8–27)
Bilirubin Total: 0.4 mg/dL (ref 0.0–1.2)
CO2: 25 mmol/L (ref 20–29)
Calcium: 9.7 mg/dL (ref 8.7–10.3)
Chloride: 105 mmol/L (ref 96–106)
Creatinine, Ser: 0.85 mg/dL (ref 0.57–1.00)
Globulin, Total: 2.4 g/dL (ref 1.5–4.5)
Glucose: 88 mg/dL (ref 70–99)
Potassium: 4.8 mmol/L (ref 3.5–5.2)
Sodium: 143 mmol/L (ref 134–144)
Total Protein: 6.8 g/dL (ref 6.0–8.5)
eGFR: 76 mL/min/{1.73_m2} (ref >59)

## 2023-11-05 LAB — LIPID PANEL
Chol/HDL Ratio: 3.6 {ratio} (ref 0.0–4.4)
Cholesterol, Total: 195 mg/dL (ref 100–199)
HDL: 54 mg/dL (ref >39)
LDL Chol Calc (NIH): 121 mg/dL — ABNORMAL HIGH (ref 0–99)
Triglycerides: 110 mg/dL (ref 0–149)
VLDL Cholesterol Cal: 20 mg/dL (ref 5–40)

## 2023-11-05 LAB — CBC
Hematocrit: 42.5 % (ref 34.0–46.6)
Hemoglobin: 13.9 g/dL (ref 11.1–15.9)
MCH: 28.9 pg (ref 26.6–33.0)
MCHC: 32.7 g/dL (ref 31.5–35.7)
MCV: 88 fL (ref 79–97)
Platelets: 344 10*3/uL (ref 150–450)
RBC: 4.81 x10E6/uL (ref 3.77–5.28)
RDW: 13 % (ref 11.7–15.4)
WBC: 8.4 10*3/uL (ref 3.4–10.8)

## 2023-11-05 LAB — TSH+T4F+T3FREE
Free T4: 1.3 ng/dL (ref 0.82–1.77)
T3, Free: 2.6 pg/mL (ref 2.0–4.4)
TSH: 2.11 u[IU]/mL (ref 0.450–4.500)

## 2023-11-05 LAB — HEMOGLOBIN A1C
Est. average glucose Bld gHb Est-mCnc: 117 mg/dL
Hgb A1c MFr Bld: 5.7 % — ABNORMAL HIGH (ref 4.8–5.6)

## 2023-11-11 ENCOUNTER — Other Ambulatory Visit: Payer: Self-pay

## 2023-11-11 DIAGNOSIS — L719 Rosacea, unspecified: Secondary | ICD-10-CM

## 2023-11-11 MED ORDER — IVERMECTIN 1 % EX CREA
1.0000 | TOPICAL_CREAM | Freq: Every evening | CUTANEOUS | 0 refills | Status: AC
Start: 2023-11-11 — End: ?

## 2023-11-11 NOTE — Progress Notes (Signed)
 1 RF per patient request. aw

## 2024-01-12 ENCOUNTER — Ambulatory Visit: Payer: No Typology Code available for payment source | Admitting: Dermatology

## 2024-01-12 ENCOUNTER — Encounter: Payer: Self-pay | Admitting: Dermatology

## 2024-01-12 DIAGNOSIS — D2261 Melanocytic nevi of right upper limb, including shoulder: Secondary | ICD-10-CM

## 2024-01-12 DIAGNOSIS — D2372 Other benign neoplasm of skin of left lower limb, including hip: Secondary | ICD-10-CM

## 2024-01-12 DIAGNOSIS — W908XXA Exposure to other nonionizing radiation, initial encounter: Secondary | ICD-10-CM | POA: Diagnosis not present

## 2024-01-12 DIAGNOSIS — L814 Other melanin hyperpigmentation: Secondary | ICD-10-CM

## 2024-01-12 DIAGNOSIS — Z1283 Encounter for screening for malignant neoplasm of skin: Secondary | ICD-10-CM

## 2024-01-12 DIAGNOSIS — D2271 Melanocytic nevi of right lower limb, including hip: Secondary | ICD-10-CM

## 2024-01-12 DIAGNOSIS — L578 Other skin changes due to chronic exposure to nonionizing radiation: Secondary | ICD-10-CM

## 2024-01-12 DIAGNOSIS — D239 Other benign neoplasm of skin, unspecified: Secondary | ICD-10-CM

## 2024-01-12 DIAGNOSIS — D229 Melanocytic nevi, unspecified: Secondary | ICD-10-CM

## 2024-01-12 DIAGNOSIS — L719 Rosacea, unspecified: Secondary | ICD-10-CM

## 2024-01-12 DIAGNOSIS — D225 Melanocytic nevi of trunk: Secondary | ICD-10-CM

## 2024-01-12 DIAGNOSIS — D2272 Melanocytic nevi of left lower limb, including hip: Secondary | ICD-10-CM

## 2024-01-12 DIAGNOSIS — D1801 Hemangioma of skin and subcutaneous tissue: Secondary | ICD-10-CM

## 2024-01-12 DIAGNOSIS — L237 Allergic contact dermatitis due to plants, except food: Secondary | ICD-10-CM

## 2024-01-12 DIAGNOSIS — L821 Other seborrheic keratosis: Secondary | ICD-10-CM

## 2024-01-12 NOTE — Patient Instructions (Signed)

## 2024-01-12 NOTE — Progress Notes (Signed)
 Follow-Up Visit   Subjective  Yolanda Fox is a 63 y.o. female who presents for the following: Skin Cancer Screening and Full Body Skin Exam. No personal Hx of skin cancer or dysplastic nevi. Hx of AK.   The patient presents for Total-Body Skin Exam (TBSE) for skin cancer screening and mole check. The patient has spots, moles and lesions to be evaluated, some may be new or changing and the patient may have concern these could be cancer.    The following portions of the chart were reviewed this encounter and updated as appropriate: medications, allergies, medical history  Review of Systems:  No other skin or systemic complaints except as noted in HPI or Assessment and Plan.  Objective  Well appearing patient in no apparent distress; mood and affect are within normal limits.  A full examination was performed including scalp, head, eyes, ears, nose, lips, neck, chest, axillae, abdomen, back, buttocks, bilateral upper extremities, bilateral lower extremities, hands, feet, fingers, toes, fingernails, and toenails. All findings within normal limits unless otherwise noted below.   Relevant physical exam findings are noted in the Assessment and Plan.    Assessment & Plan   SKIN CANCER SCREENING PERFORMED TODAY.  ACTINIC DAMAGE. Chest - Chronic condition, secondary to cumulative UV/sun exposure - diffuse scaly erythematous macules with underlying dyspigmentation - Recommend daily broad spectrum sunscreen SPF 30+ to sun-exposed areas, reapply every 2 hours as needed.  - Staying in the shade or wearing long sleeves, sun glasses (UVA+UVB protection) and wide brim hats (4-inch brim around the entire circumference of the hat) are also recommended for sun protection.  - Call for new or changing lesions.  LENTIGINES, SEBORRHEIC KERATOSES, HEMANGIOMAS. Back  - Benign normal skin lesions - Benign-appearing - Call for any changes  MELANOCYTIC NEVI - Tan-brown and/or pink-flesh-colored  symmetric macules and papules  - 2.76mm med dark brown macule at left lower back    - 8.0 x 5.35mm speckled brown macule at left lateral breast  - 2.0 mm med dark brown macules x 2 at right lateral breast   - 7.0 x 4.53mm brown pap at right lower flank  - 4.0 mm brown papule with emerging hair at right anterior shoulder      - 3.0 mm thin brown papule, two-toned at right upper calf  - 2.5 mm med dark brown macule at right upper calf  - 2.74mm med dark brown macule at left medial thigh   - 3.0 mm med dark brown thin papule, recheck on f/up at right lateral popliteal    - Benign appearing on exam today - Observation - Call clinic for new or changing moles - Recommend daily use of broad spectrum spf 30+ sunscreen to sun-exposed areas.    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 Currently well-controlled.   Rosacea is a chronic progressive skin condition usually affecting the face of adults, causing redness and/or acne bumps. It is treatable but not curable. It sometimes affects the eyes (ocular rosacea) as well. It may respond to topical and/or systemic medication and can flare with stress, sun exposure, alcohol, exercise, topical steroids (including hydrocortisone/cortisone 10) and some foods.  Daily application of broad spectrum spf 30+ sunscreen to face is recommended to reduce flares.  Patient denies grittiness of the eyes  Treatment Plan  Continue Soolantra  Cream Apply to face QHS for rosacea.    ALLERGIC CONTACT DERMATITIS/Poison James Mcardle Exam: linear  pink papules at right elbow with h/o recently working out in yard    Treatment Plan: Start Clobetasol 0.05% cream twice a day up to 2 weeks as needed for poison ivy. Avoid applying to face, groin, and axilla. Use as directed. Long-term use can cause thinning of the skin.    SEBORRHEIC KERATOSIS - Left suprapubic area.  -  Benign-appearing - Discussed benign etiology and prognosis. - Observe - Call for any changes   DERMATOFIBROMA Exam: Firm pink/brown papulenodule with dimple sign of the left medial calf. Treatment Plan: A dermatofibroma is a benign growth possibly related to trauma, such as an insect bite, cut from shaving, or inflamed acne-type bump.  Treatment options to remove include shave or excision with resulting scar and risk of recurrence.  Since benign-appearing and not bothersome, will observe for now.      Return in about 1 year (around 01/11/2025) for TBSE.  I, Darcie Easterly, CMA, am acting as scribe for Artemio Larry, MD.   Documentation: I have reviewed the above documentation for accuracy and completeness, and I agree with the above.  Artemio Larry, MD

## 2024-01-21 ENCOUNTER — Other Ambulatory Visit: Payer: Self-pay | Admitting: Orthopedic Surgery

## 2024-01-21 DIAGNOSIS — M25552 Pain in left hip: Secondary | ICD-10-CM

## 2024-01-21 DIAGNOSIS — M87052 Idiopathic aseptic necrosis of left femur: Secondary | ICD-10-CM

## 2024-02-02 ENCOUNTER — Ambulatory Visit
Admission: RE | Admit: 2024-02-02 | Discharge: 2024-02-02 | Disposition: A | Discharge status: Home / Self Care | Source: Ambulatory Visit | Attending: Orthopedic Surgery | Admitting: Orthopedic Surgery

## 2024-02-02 DIAGNOSIS — M25552 Pain in left hip: Secondary | ICD-10-CM | POA: Insufficient documentation

## 2024-02-02 DIAGNOSIS — M87052 Idiopathic aseptic necrosis of left femur: Secondary | ICD-10-CM | POA: Insufficient documentation

## 2024-05-03 ENCOUNTER — Encounter: Payer: Self-pay | Admitting: Family Medicine

## 2024-05-03 ENCOUNTER — Ambulatory Visit: Payer: No Typology Code available for payment source | Admitting: Family Medicine

## 2024-05-03 VITALS — BP 107/48 | HR 62 | Resp 16 | Ht 63.0 in | Wt 167.4 lb

## 2024-05-03 DIAGNOSIS — M19042 Primary osteoarthritis, left hand: Secondary | ICD-10-CM

## 2024-05-03 DIAGNOSIS — M19041 Primary osteoarthritis, right hand: Secondary | ICD-10-CM

## 2024-05-03 DIAGNOSIS — K219 Gastro-esophageal reflux disease without esophagitis: Secondary | ICD-10-CM

## 2024-05-03 DIAGNOSIS — E039 Hypothyroidism, unspecified: Secondary | ICD-10-CM

## 2024-05-03 DIAGNOSIS — Z8679 Personal history of other diseases of the circulatory system: Secondary | ICD-10-CM | POA: Insufficient documentation

## 2024-05-03 DIAGNOSIS — M25552 Pain in left hip: Secondary | ICD-10-CM

## 2024-05-03 DIAGNOSIS — E559 Vitamin D deficiency, unspecified: Secondary | ICD-10-CM

## 2024-05-03 DIAGNOSIS — I700 Atherosclerosis of aorta: Secondary | ICD-10-CM | POA: Insufficient documentation

## 2024-05-03 DIAGNOSIS — R7303 Prediabetes: Secondary | ICD-10-CM

## 2024-05-03 DIAGNOSIS — R911 Solitary pulmonary nodule: Secondary | ICD-10-CM

## 2024-05-03 NOTE — Assessment & Plan Note (Signed)
 Palpitations with supraventricular ectopy. Metoprolol  prescribed as needed but not used. Cardiologist indicated potential progression to AFib but currently stable. - Continue metoprolol  12.5 mg twice daily as needed for palpitations.

## 2024-05-03 NOTE — Assessment & Plan Note (Signed)
 Chronic  Vitamin D  deficiency managed with vitamin D3 supplementation. Last level was 43 in 2021. - Check vitamin D  level at next evaluation in 2026 during annual physical

## 2024-05-03 NOTE — Assessment & Plan Note (Signed)
 Chronic GERD with esophageal stricture is well-managed with Protonix . Benefits of Protonix  outweigh risks due to potential esophageal cancer and choking from strictures. - Continue Protonix  40 mg daily.

## 2024-05-03 NOTE — Assessment & Plan Note (Signed)
 Chronic hypothyroidism is well-managed with current treatment. - Continue Synthroid  100 mcg daily before breakfast.

## 2024-05-03 NOTE — Assessment & Plan Note (Signed)
 Chronic  Prediabetes with A1c at 5.7% and family history of diabetes. Lifestyle modifications discussed. She prefers to wait until February evaluation to consider metformin. - Re-evaluate A1c in February 2026. - Consider metformin if A1c remains elevated.

## 2024-05-03 NOTE — Progress Notes (Signed)
 Established patient visit   Patient: Yolanda Fox   DOB: [birth year]   63 y.o. Female  MRN: [id number] Visit Date: 05/03/2024  Today's healthcare provider: Rockie Agent, MD   Chief Complaint  Patient presents with   Follow-up    F/u . No other concerns   Subjective     HPI     Follow-up    Additional comments: F/u . No other concerns      Last edited by Marylen Odella CROME, CMA on 05/03/2024  9:10 AM.       Discussed the use of AI scribe software for clinical note transcription with the patient, who gave verbal consent to proceed.  History of Present Illness Yolanda Fox is a 63 year old female who presents for follow-up care.  She is currently taking Protonix  40 mg daily for GERD, Synthroid  100 mcg daily for hypothyroidism, and vitamin D3 25 mcg every other day for vitamin D  deficiency. She has metoprolol  12.5 mg twice daily as needed for palpitations but has never taken it.  She has a history of being diagnosed with exercise-induced asthma, which was later refuted by a cardiopulmonologist who attributed her symptoms to her heart and lungs not working together properly. She experiences an increase in heart rate during physical activity, such as swimming or hiking, but has not pursued further treatment as she does not feel significantly unwell. No current palpitations, shortness of breath, or significant chest pain.  She has a history of lung nodules that resolved spontaneously between December 2021 and March 2022, with no acute issues noted in the chest since then. She was informed of aortic atherosclerosis but maintains a low ten-year risk score for heart attack or stroke.  She has a history of colon polyps and has undergone regular colonoscopies every three years. The most recent colonoscopy was in 2020. She is concerned about this due to a family history of colon cancer.  She experiences occasional back pain and muscle spasms, for which she uses Flexeril   5 mg as needed. She was advised by her hip doctor that she will eventually need a left hip replacement but is currently managing well.  She has a family history of diabetes, with her son being insulin-dependent and her brother having diabetes. Her A1c has remained at 5.7% for years, placing her in the prediabetes range. She is concerned about this due to her family history.  She has a history of vitamin D  deficiency, with her last recorded level being 43 in 2021. She takes vitamin D3 25 mcg every other day, adjusting her intake based on seasonal sun exposure.     Past Medical History:  Diagnosis Date   Acute pharyngitis    Benign neoplasm of colon    Bicornuate uterus    Degeneration of intervertebral disc, site unspecified    Esophageal reflux    External thrombosed hemorrhoids    Hematuria, unspecified    Nontoxic uninodular goiter    Osteoarthrosis, unspecified whether generalized or localized, unspecified site    Pulmonary nodules/lesions, multiple 09/21/2020   Thyroid  nodule 03/01/2015   Left; s/p right thyroidectomy   Unspecified adjustment reaction    Unspecified hypothyroidism    Unspecified vitamin D  deficiency     Medications: Outpatient Medications Prior to Visit  Medication Sig   cholecalciferol (VITAMIN D3) 25 MCG (1000 UNIT) tablet every other day.   cyclobenzaprine  (FLEXERIL ) 5 MG tablet Take 1 tablet (5 mg total) by mouth at bedtime.   fluticasone (  FLONASE) 50 MCG/ACT nasal spray Place into both nostrils at bedtime as needed for allergies or rhinitis.   Ivermectin  (SOOLANTRA ) 1 % CREA Apply 1 application  topically at bedtime.   levothyroxine  (SYNTHROID ) 100 MCG tablet Take 1 tablet (100 mcg total) by mouth daily before breakfast.   loratadine (CLARITIN) 10 MG tablet Take 10 mg by mouth daily.   metoprolol  tartrate (LOPRESSOR ) 25 MG tablet Take 0.5 tablets (12.5 mg total) by mouth 2 (two) times daily as needed (for palpitations.).   Multiple Vitamin (MULTIVITAMIN)  capsule Take 1 capsule by mouth daily.   pantoprazole  (PROTONIX ) 40 MG tablet Take 1 tablet (40 mg total) by mouth daily.   No facility-administered medications prior to visit.    Review of Systems  Last metabolic panel Lab Results  Component Value Date   GLUCOSE 88 11/04/2023   NA 143 11/04/2023   K 4.8 11/04/2023   CL 105 11/04/2023   CO2 25 11/04/2023   BUN 15 11/04/2023   CREATININE 0.85 11/04/2023   EGFR 76 11/04/2023   CALCIUM 9.7 11/04/2023   PROT 6.8 11/04/2023   ALBUMIN 4.4 11/04/2023   LABGLOB 2.4 11/04/2023   AGRATIO 2.4 (H) 10/17/2022   BILITOT 0.4 11/04/2023   ALKPHOS 89 11/04/2023   AST 21 11/04/2023   ALT 22 11/04/2023   Last lipids Lab Results  Component Value Date   CHOL 195 11/04/2023   HDL 54 11/04/2023   LDLCALC 121 (H) 11/04/2023   TRIG 110 11/04/2023   CHOLHDL 3.6 11/04/2023   The 10-year ASCVD risk score (Arnett DK, et al., 2019) is: 3.5%  Last hemoglobin A1c Lab Results  Component Value Date   HGBA1C 5.7 (H) 11/04/2023   Last thyroid  functions Lab Results  Component Value Date   TSH 2.110 11/04/2023        Objective    BP (!) 107/48 (BP Location: Right Arm, Patient Position: Sitting, Cuff Size: Normal)   Pulse 62   Resp 16   Ht 5' 3 (1.6 m)   Wt 167 lb 6.4 oz (75.9 kg)   SpO2 98%   BMI 29.65 kg/m   BP Readings from Last 3 Encounters:  05/03/24 (!) 107/48  11/04/23 128/65  01/06/23 129/79   Wt Readings from Last 3 Encounters:  05/03/24 167 lb 6.4 oz (75.9 kg)  11/04/23 169 lb (76.7 kg)  10/17/22 164 lb 12.8 oz (74.8 kg)        Physical Exam Vitals reviewed.  Constitutional:      General: She is not in acute distress.    Appearance: Normal appearance. She is not ill-appearing.  Cardiovascular:     Rate and Rhythm: Normal rate and regular rhythm.  Pulmonary:     Effort: Pulmonary effort is normal. No respiratory distress.     Breath sounds: No wheezing, rhonchi or rales.  Neurological:     Mental Status: She  is alert and oriented to person, place, and time.  Psychiatric:        Mood and Affect: Mood normal.        Behavior: Behavior normal.       No results found for any visits on 05/03/24.  Assessment & Plan     Problem List Items Addressed This Visit       Cardiovascular and Mediastinum   Aortic atherosclerosis (HCC)   Noted on prior Chest CT from 2022 Aortic atherosclerosis with LDL at 121 and ten-year cardiovascular risk of 3.5%. - Advise lifestyle modifications including diet and exercise  to manage cholesterol. Hyperlipidemia Hyperlipidemia with LDL at 121. She has been taking salmon oil supplements since February. - Re-evaluate cholesterol levels at next physical in February 2026.        Respiratory   Lung nodule   Resolved on follow up imaging  No additional treatment needed         Digestive   Acid reflux   Chronic GERD with esophageal stricture is well-managed with Protonix . Benefits of Protonix  outweigh risks due to potential esophageal cancer and choking from strictures. - Continue Protonix  40 mg daily.        Endocrine   Hypothyroidism - Primary   Chronic hypothyroidism is well-managed with current treatment. - Continue Synthroid  100 mcg daily before breakfast.        Other   Vitamin D  deficiency   Chronic  Vitamin D  deficiency managed with vitamin D3 supplementation. Last level was 43 in 2021. - Check vitamin D  level at next evaluation in 2026 during annual physical       Prediabetes   Chronic  Prediabetes with A1c at 5.7% and family history of diabetes. Lifestyle modifications discussed. She prefers to wait until February evaluation to consider metformin. - Re-evaluate A1c in February 2026. - Consider metformin if A1c remains elevated.      Pain of left hip    Chronic  Followed by orthopedics, counseled that she will likely need hip replacement in the future       H/O paroxysmal atrial tachycardia   Palpitations with supraventricular  ectopy. Metoprolol  prescribed as needed but not used. Cardiologist indicated potential progression to AFib but currently stable. - Continue metoprolol  12.5 mg twice daily as needed for palpitations.      Other Visit Diagnoses       Arthritis of both hands            Assessment & Plan    Osteoarthritis of hands Osteoarthritis in hands causing swelling and discomfort. Conservative management with ice and heat therapy discussed. - Advise use of ice for 10-15 minutes at night to reduce swelling. - Advise use of heat in the morning to alleviate stiffness.  Left hip osteoarthritis, pending future hip replacement Left hip osteoarthritis managed conservatively with eventual need for hip replacement. Recent back spasms due to muscle adjustment after spine compression. - Continue conservative management until hip replacement is necessary.     Return in about 6 months (around 11/04/2024) for CPE.       Rockie Agent, MD  [facility] [phone] (phone) [phone] (fax)  [facility]

## 2024-05-03 NOTE — Assessment & Plan Note (Signed)
  Chronic  Followed by orthopedics, counseled that she will likely need hip replacement in the future

## 2024-05-03 NOTE — Assessment & Plan Note (Signed)
 Resolved on follow up imaging  No additional treatment needed

## 2024-05-03 NOTE — Assessment & Plan Note (Addendum)
 Noted on prior Chest CT from 2022 Aortic atherosclerosis with LDL at 121 and ten-year cardiovascular risk of 3.5%. - Advise lifestyle modifications including diet and exercise to manage cholesterol. Hyperlipidemia Hyperlipidemia with LDL at 121. She has been taking salmon oil supplements since February. - Re-evaluate cholesterol levels at next physical in February 2026.

## 2024-06-29 ENCOUNTER — Other Ambulatory Visit: Payer: Self-pay | Admitting: Family Medicine

## 2024-06-29 DIAGNOSIS — Z1231 Encounter for screening mammogram for malignant neoplasm of breast: Secondary | ICD-10-CM

## 2024-06-30 ENCOUNTER — Ambulatory Visit
Admission: RE | Admit: 2024-06-30 | Discharge: 2024-06-30 | Disposition: A | Discharge status: Home / Self Care | Source: Ambulatory Visit | Attending: Family Medicine | Admitting: Family Medicine

## 2024-06-30 DIAGNOSIS — Z1231 Encounter for screening mammogram for malignant neoplasm of breast: Secondary | ICD-10-CM | POA: Insufficient documentation

## 2024-07-10 ENCOUNTER — Ambulatory Visit: Payer: Self-pay | Admitting: Family Medicine

## 2025-02-01 ENCOUNTER — Ambulatory Visit: Admitting: Dermatology
